# Patient Record
Sex: Female | Born: 1940 | Race: White | Hispanic: No | Marital: Married | Smoking: Never smoker
Health system: Southern US, Community
[De-identification: ages and names within clinical notes are randomized; demographics above are authoritative.]

## PROBLEM LIST (undated history)

## (undated) DIAGNOSIS — E538 Deficiency of other specified B group vitamins: Secondary | ICD-10-CM

## (undated) DIAGNOSIS — E559 Vitamin D deficiency, unspecified: Secondary | ICD-10-CM

## (undated) DIAGNOSIS — H43819 Vitreous degeneration, unspecified eye: Secondary | ICD-10-CM

## (undated) DIAGNOSIS — F418 Other specified anxiety disorders: Secondary | ICD-10-CM

## (undated) DIAGNOSIS — M1990 Unspecified osteoarthritis, unspecified site: Secondary | ICD-10-CM

## (undated) DIAGNOSIS — K602 Anal fissure, unspecified: Secondary | ICD-10-CM

## (undated) DIAGNOSIS — E785 Hyperlipidemia, unspecified: Secondary | ICD-10-CM

## (undated) DIAGNOSIS — M353 Polymyalgia rheumatica: Secondary | ICD-10-CM

## (undated) DIAGNOSIS — I10 Essential (primary) hypertension: Secondary | ICD-10-CM

## (undated) DIAGNOSIS — D128 Benign neoplasm of rectum: Secondary | ICD-10-CM

## (undated) DIAGNOSIS — M8580 Other specified disorders of bone density and structure, unspecified site: Secondary | ICD-10-CM

## (undated) DIAGNOSIS — K219 Gastro-esophageal reflux disease without esophagitis: Secondary | ICD-10-CM

## (undated) DIAGNOSIS — H269 Unspecified cataract: Secondary | ICD-10-CM

## (undated) DIAGNOSIS — E039 Hypothyroidism, unspecified: Secondary | ICD-10-CM

## (undated) DIAGNOSIS — K5732 Diverticulitis of large intestine without perforation or abscess without bleeding: Secondary | ICD-10-CM

## (undated) DIAGNOSIS — K222 Esophageal obstruction: Secondary | ICD-10-CM

## (undated) DIAGNOSIS — G4700 Insomnia, unspecified: Secondary | ICD-10-CM

## (undated) HISTORY — DX: Anal fissure, unspecified: K60.2

## (undated) HISTORY — DX: Deficiency of other specified B group vitamins: E53.8

## (undated) HISTORY — PX: TOTAL ABDOMINAL HYSTERECTOMY W/ BILATERAL SALPINGOOPHORECTOMY: SHX83

## (undated) HISTORY — DX: Vitreous degeneration, unspecified eye: H43.819

## (undated) HISTORY — DX: Esophageal obstruction: K22.2

## (undated) HISTORY — DX: Benign neoplasm of rectum: D12.8

## (undated) HISTORY — DX: Unspecified cataract: H26.9

## (undated) HISTORY — PX: WISDOM TOOTH EXTRACTION: SHX21

## (undated) HISTORY — PX: TENDON REPAIR: SHX5111

## (undated) HISTORY — DX: Hyperlipidemia, unspecified: E78.5

## (undated) HISTORY — DX: Unspecified osteoarthritis, unspecified site: M19.90

## (undated) HISTORY — DX: Other specified anxiety disorders: F41.8

## (undated) HISTORY — DX: Other specified disorders of bone density and structure, unspecified site: M85.80

## (undated) HISTORY — PX: ROTATOR CUFF REPAIR: SHX139

## (undated) HISTORY — PX: APPENDECTOMY: SHX54

## (undated) HISTORY — DX: Gastro-esophageal reflux disease without esophagitis: K21.9

## (undated) HISTORY — DX: Vitamin D deficiency, unspecified: E55.9

## (undated) HISTORY — DX: Diverticulitis of large intestine without perforation or abscess without bleeding: K57.32

## (undated) HISTORY — DX: Essential (primary) hypertension: I10

## (undated) HISTORY — DX: Insomnia, unspecified: G47.00

## (undated) HISTORY — PX: BREAST CYST ASPIRATION: SHX578

---

## 1998-10-08 ENCOUNTER — Other Ambulatory Visit: Admission: RE | Admit: 1998-10-08 | Discharge: 1998-10-08 | Payer: Self-pay | Admitting: Obstetrics and Gynecology

## 1999-08-02 ENCOUNTER — Other Ambulatory Visit: Admission: RE | Admit: 1999-08-02 | Discharge: 1999-08-02 | Payer: Self-pay | Admitting: Obstetrics and Gynecology

## 1999-08-02 ENCOUNTER — Encounter (INDEPENDENT_AMBULATORY_CARE_PROVIDER_SITE_OTHER): Payer: Self-pay

## 1999-08-31 ENCOUNTER — Encounter: Payer: Self-pay | Admitting: Obstetrics and Gynecology

## 1999-08-31 ENCOUNTER — Encounter: Admission: RE | Admit: 1999-08-31 | Discharge: 1999-08-31 | Payer: Self-pay | Admitting: Obstetrics and Gynecology

## 1999-09-03 ENCOUNTER — Encounter: Admission: RE | Admit: 1999-09-03 | Discharge: 1999-09-03 | Payer: Self-pay | Admitting: Obstetrics and Gynecology

## 1999-09-03 ENCOUNTER — Encounter: Payer: Self-pay | Admitting: Obstetrics and Gynecology

## 1999-10-15 ENCOUNTER — Other Ambulatory Visit: Admission: RE | Admit: 1999-10-15 | Discharge: 1999-10-15 | Payer: Self-pay | Admitting: Obstetrics and Gynecology

## 1999-12-13 ENCOUNTER — Encounter: Admission: RE | Admit: 1999-12-13 | Discharge: 1999-12-13 | Payer: Self-pay | Admitting: Obstetrics and Gynecology

## 1999-12-13 ENCOUNTER — Encounter: Payer: Self-pay | Admitting: Obstetrics and Gynecology

## 2000-02-15 ENCOUNTER — Ambulatory Visit (HOSPITAL_COMMUNITY): Admission: RE | Admit: 2000-02-15 | Discharge: 2000-02-15 | Payer: Self-pay | Admitting: Obstetrics and Gynecology

## 2000-02-15 ENCOUNTER — Encounter: Payer: Self-pay | Admitting: Obstetrics and Gynecology

## 2000-03-06 ENCOUNTER — Encounter (INDEPENDENT_AMBULATORY_CARE_PROVIDER_SITE_OTHER): Payer: Self-pay

## 2000-03-06 ENCOUNTER — Other Ambulatory Visit: Admission: RE | Admit: 2000-03-06 | Discharge: 2000-03-06 | Payer: Self-pay | Admitting: Obstetrics and Gynecology

## 2000-03-16 ENCOUNTER — Encounter: Payer: Self-pay | Admitting: Obstetrics and Gynecology

## 2000-03-20 ENCOUNTER — Inpatient Hospital Stay (HOSPITAL_COMMUNITY): Admission: RE | Admit: 2000-03-20 | Discharge: 2000-03-24 | Payer: Self-pay | Admitting: Obstetrics and Gynecology

## 2000-03-20 ENCOUNTER — Encounter (INDEPENDENT_AMBULATORY_CARE_PROVIDER_SITE_OTHER): Payer: Self-pay | Admitting: Specialist

## 2000-04-24 ENCOUNTER — Encounter: Admission: RE | Admit: 2000-04-24 | Discharge: 2000-04-24 | Payer: Self-pay

## 2000-04-24 ENCOUNTER — Encounter: Payer: Self-pay | Admitting: Obstetrics and Gynecology

## 2000-07-25 ENCOUNTER — Encounter: Payer: Self-pay | Admitting: Obstetrics and Gynecology

## 2000-07-25 ENCOUNTER — Encounter: Admission: RE | Admit: 2000-07-25 | Discharge: 2000-07-25 | Payer: Self-pay | Admitting: Obstetrics and Gynecology

## 2001-02-27 ENCOUNTER — Encounter: Admission: RE | Admit: 2001-02-27 | Discharge: 2001-02-27 | Payer: Self-pay | Admitting: Obstetrics and Gynecology

## 2001-02-27 ENCOUNTER — Encounter: Payer: Self-pay | Admitting: Obstetrics and Gynecology

## 2001-03-19 ENCOUNTER — Other Ambulatory Visit: Admission: RE | Admit: 2001-03-19 | Discharge: 2001-03-19 | Payer: Self-pay | Admitting: Obstetrics and Gynecology

## 2001-07-20 ENCOUNTER — Encounter: Payer: Self-pay | Admitting: Emergency Medicine

## 2001-07-20 ENCOUNTER — Emergency Department (HOSPITAL_COMMUNITY): Admission: EM | Admit: 2001-07-20 | Discharge: 2001-07-20 | Payer: Self-pay | Admitting: Emergency Medicine

## 2002-03-01 ENCOUNTER — Encounter: Admission: RE | Admit: 2002-03-01 | Discharge: 2002-03-01 | Payer: Self-pay | Admitting: Obstetrics and Gynecology

## 2002-03-01 ENCOUNTER — Encounter: Payer: Self-pay | Admitting: Obstetrics and Gynecology

## 2002-03-21 ENCOUNTER — Other Ambulatory Visit: Admission: RE | Admit: 2002-03-21 | Discharge: 2002-03-21 | Payer: Self-pay | Admitting: Obstetrics and Gynecology

## 2002-03-26 ENCOUNTER — Encounter: Admission: RE | Admit: 2002-03-26 | Discharge: 2002-03-26 | Payer: Self-pay | Admitting: Obstetrics and Gynecology

## 2002-03-26 ENCOUNTER — Encounter: Payer: Self-pay | Admitting: Obstetrics and Gynecology

## 2003-03-05 ENCOUNTER — Encounter: Payer: Self-pay | Admitting: Obstetrics and Gynecology

## 2003-03-05 ENCOUNTER — Encounter: Admission: RE | Admit: 2003-03-05 | Discharge: 2003-03-05 | Payer: Self-pay | Admitting: Obstetrics and Gynecology

## 2003-04-23 ENCOUNTER — Other Ambulatory Visit: Admission: RE | Admit: 2003-04-23 | Discharge: 2003-04-23 | Payer: Self-pay | Admitting: Obstetrics and Gynecology

## 2004-03-24 ENCOUNTER — Encounter: Admission: RE | Admit: 2004-03-24 | Discharge: 2004-03-24 | Payer: Self-pay | Admitting: Obstetrics and Gynecology

## 2004-12-13 ENCOUNTER — Ambulatory Visit (HOSPITAL_COMMUNITY): Admission: RE | Admit: 2004-12-13 | Discharge: 2004-12-13 | Payer: Self-pay | Admitting: Gastroenterology

## 2005-04-01 ENCOUNTER — Encounter: Admission: RE | Admit: 2005-04-01 | Discharge: 2005-04-01 | Payer: Self-pay | Admitting: Internal Medicine

## 2006-04-04 ENCOUNTER — Encounter: Admission: RE | Admit: 2006-04-04 | Discharge: 2006-04-04 | Payer: Self-pay | Admitting: Internal Medicine

## 2007-04-09 ENCOUNTER — Encounter: Admission: RE | Admit: 2007-04-09 | Discharge: 2007-04-09 | Payer: Self-pay | Admitting: Internal Medicine

## 2008-04-29 ENCOUNTER — Encounter: Admission: RE | Admit: 2008-04-29 | Discharge: 2008-04-29 | Payer: Self-pay | Admitting: Internal Medicine

## 2009-05-04 ENCOUNTER — Encounter: Admission: RE | Admit: 2009-05-04 | Discharge: 2009-05-04 | Payer: Self-pay | Admitting: Internal Medicine

## 2010-05-12 ENCOUNTER — Encounter: Admission: RE | Admit: 2010-05-12 | Discharge: 2010-05-12 | Payer: Self-pay | Admitting: Internal Medicine

## 2010-09-27 ENCOUNTER — Encounter: Payer: Self-pay | Admitting: Internal Medicine

## 2011-01-21 NOTE — Op Note (Signed)
NAME:  Stephanie Beasley, Stephanie Beasley                 ACCOUNT NO.:  [id number]   MEDICAL RECORD NO.:  [id number]          PATIENT TYPE:  AMB   LOCATION:  ENDO                         FACILITY:  [facility]   PHYSICIAN:  Anselmo Rod, M.D.  DATE OF BIRTH:  [birth year]   DATE OF PROCEDURE:  12/13/2004  DATE OF DISCHARGE:                                 OPERATIVE REPORT   PROCEDURE PERFORMED:  Screening colonoscopy.   INSTRUMENT USED:  Olympus video colonoscope.   INDICATION FOR PROCEDURE:  A 70 year old white female undergoing screening  colonoscopy to rule out colonic polyps, masses, etc.   PREPROCEDURE PREPARATION:  Informed consent was procured from the patient.  The patient fasted for eight hours prior to the procedure and prepped with a  bottle of magnesium citrate and a gallon of GoLYTELY the night prior to the  procedure.  The risks and benefits of the procedure, including a 10% miss  rate of cancer and polyps, were discussed with her as well.   PHYSICAL EXAMINATION:  VITAL SIGNS:  The patient had stable vital signs.  NECK:  Supple.  CHEST:  Clear to auscultation.  S1, S2 regular.  ABDOMEN:  Soft with normal bowel sounds.   DESCRIPTION OF PROCEDURE:  The patient was placed in the left lateral  decubitus position and sedated with 70 mg of Demerol and 7 mg of Versed in  slow incremental doses.  Once the patient was adequately sedate and  maintained on low-flow oxygen and continuous cardiac monitoring, the Olympus  video colonoscope was advanced from the rectum to the cecum.  The  appendiceal orifice and the ileocecal valve were clearly visualized and  photographed.  Early sigmoid diverticula were noted.  Small internal  hemorrhoids were appreciated on retroflexion in the rectum.  No masses or  polyps were identified.  The patient tolerated the procedure well without  complications.   IMPRESSION:  1.  Normal colonoscopy up to the cecum except for a few early sigmoid      diverticula and small  internal hemorrhoids.  2.  No masses or polyps seen.   RECOMMENDATIONS:  1.  Continue a high-fiber diet with liberal fluid intake.  2.  Repeat colonoscopy in the next five years unless the patient develops      any abnormal symptoms in the interim.  3.  Outpatient follow-up as the need arises in the future.      JNM/MEDQ  D:  12/13/2004  T:  12/13/2004  Job:  [id number]   cc:   Gwen Pounds, MD  Fax: [phone]   Crist Fat. Rivard, M.D.  [address]  [place]  [place] [address]  Fax: [phone]

## 2011-01-21 NOTE — Discharge Summary (Signed)
[facility]  Patient:    CHEVY, VIRGO                        MRN: [id number] Adm. Date:  [id number] Disc. Date: [id number] Attending:  Silverio Lay A                           Discharge Summary  REASON FOR ADMISSION:  Large pelvic mass.  HISTORY OF PRESENT ILLNESS:  This is a 70 year old married white female who was investigated in the office for a large pelvic mass of new onset. Preoperative findings were compatible with a fast growing lower segment uterine fibroid.  The patient was admitted to undergo exploratory laparotomy on March 20, 2000, which reveled a cervical fibroid tumor of 9 x 8 x 9 cm.  The patient underwent radical hysterectomy and bilateral salpingo-oophorectomy without complications with total intraoperative blood loss of 700 cc.  Postoperative course was uneventful except for a subfebrile state secondary to atelectasia which resolved quickly with incentive spirometry.  The patient was discharged home on March 24, 2000 with a postoperative hemoglobin of 12.  She is to be followed in the office in four to six weeks for a postoperative appointment, and she is given Tylox and Motrin for pain control.  She is also instructed to call if experiencing increased pain, bleeding, or fever.  FINAL DIAGNOSIS:  Paracervical leiomyoma with degenerative changes, status post radical hysterectomy with bilateral salpingo-oophorectomy.  CONDITION ON DISCHARGE:  Well and stable. DD:  04/17/00 TD:  04/17/00 Job: [id number]

## 2011-01-21 NOTE — Op Note (Signed)
[facility]  Patient:    ARDELL, AARONSON                        MRN: [id number] Proc. Date: 03/20/00 Adm. Date:  [id number] Attending:  Esmeralda Arthur CC:         Silverio Lay, M.D.                           Operative Report  Patient of Silverio Lay, M.D.  For details of the patients operative procedure, please see the note dictated by Dr. Dois Davenport Rivard.  This patient had a very broad lower uterine segment and upper cervix.  The cervix itself was approximately 10 cm in size.  The dissection required to remove this mass was complicated by the fact that it was an unusual mass growing in a menopausal woman, and we could not explain the significance, and we were concerned that it might be malignant.  For this reason, we did not want to interrupt the mass or place any devices into the mass that might contaminate the peritoneal cavity.  The excision of the mass was difficult; therefore, in trying to come down the sidewalls, it was necessary to completely free the ureters, effectively performing a radical hysterectomy.  Both ureters were isolated from the medial peritoneum and held with quarter-inch Penrose drains.  Using a circle technique, the ureter was unroofed down through the cardinal ligament.  The ureter on the left side was far more complicated than the one on the right.  On the left side, the ureter was unroofed, and the anterior peritoneum was divided and suture ligated.  The uterine arteries were isolated as individual structures, clamped, cut, and free-tie ligated.  The dissection on the right was similar in that the ureter had to be dissected down completely to the bladder. It was much less complicated to unroof than the ureter on the left.  Once the specimen was out, the closure was standard, and see the remainder of Dr. Antonietta Breach dictation for this procedure. DD:  03/20/00 TD:  03/20/00 Job: [id number]

## 2011-01-21 NOTE — Op Note (Signed)
[facility]  Patient:    Stephanie Beasley, Stephanie Beasley                        MRN: [id number] Proc. Date: 03/20/00 Adm. Date:  [id number] Attending:  Silverio Lay A                           Operative Report  PREOPERATIVE DIAGNOSIS:  Pelvic mass.  POSTOPERATIVE DIAGNOSIS:  Large cervical leiomyoma.  ANESTHESIA:  Spinal epidural and sedation.  PROCEDURE:  Radical hysterectomy with bilateral salpingo-oophorectomy.  SURGEON:  Silverio Lay, M.D.  ASSISTANT:  Pershing Cox, M.D.  ESTIMATED BLOOD LOSS:  700 cc.  DESCRIPTION OF PROCEDURE:  After being informed of the procedure with possible complications including bleeding, infection, trauma to the bowels, bladder, and ureters, as well as the need for omentectomy and node dissection pending frozen section, informed consent was obtained.  The patient was taken to the OR and given spinal epidural anesthesia.  She was placed in the dorsal decubitus position and prepped and draped in a sterile fashion, and a Foley catheter was inserted.  After assessing adequate level of anesthesia, skin, subcutaneous tissue and fat were incised in a midline fashion from symphysis pubis to umbilical area.  Fascia was then incised in a vertical way, first with knife then with scissors.  Linea alba was isolated, and peritoneum was entered in a blunt fashion.  We proceeded with washings of the abdominopelvic cavity with 500 cc of warm saline.  Exploration then revealed normal peritoneal surfaces as well as negative periaortic nodes, normal omentum. Uterus was of normal size.  Starting at the isthmus, a large mass measuring 9 x 8 x 7 x 8 cm completely engulfed the cervix to where the lower margins were impossible to identify.  Bladder at this point is also impossible to identify. Both tubes and ovaries are normal.  Self-retaining retractors were placed. Bowels were retracted and tacked.  Uterus was then grasped with two Kelly clamps,  and both bone ligaments were sutured with a transfixed suture of 0 Vicryl, section, which allowed [place] visualization of both retroperitoneal spaces to allow [place] to clamp under direct visualization both infundibulopelvic ligaments with Roger clamps.  Those ligaments were incised, doubly sutured, first with a transfixed suture of 0 Vicryl then with the free-tie 0 Vicryl. We could then skeletonize uterine vessels, both sides, clamp them with Rogers clamps, sectioned and sutured them with a suture of 0 Vicryl, and then doubly sutured them with a suture of 0 Vicryl.  We could now open the broad ligament overlying the mass which allowed [place] blunt dissection of the bladder downward. We then advanced progressively with 1 cm pedicle overlying the mass, clamping, sectioning, and suturing with 0 Vicryl until it was impossible to continue further without acknowledging the location of both ureters.  At that point, a decision was made to do complete ueterolysis on both sides including unroofing of the ureters on both sides which was done bluntly.  Please see attached dictation by Dr. Carey Bullocks.  After doing this ueterolysis, it was safe to address the parametrium on both sides, and we could clamp both uterosacral ligaments with Rogers clamp, sectioned them, and sutured them with a transfixed suture of 0 Vicryl.  It was, after that, possible to clamp on the left side of the vaginal angle with the Rogers clamp, section it, and open the remainder of the vagina to remove  the uterus entirely with the cervix, both tubes and ovaries.  The mass was completely cervical with no borders at the exocervix.  This was sent for frozen section.  Both vaginal angles were then sutured with a transfix suture of 0 Vicryl, and both anterior and posterior borders of the vaginal cuff were then sutured with a running lock suture of 0 Vicryl for  hemostasis.  Both vaginal angles were then sutured to corresponding uterosacral ligaments  for future suspension.  The vagina was then closed with two figure-of-eight stitches of 0 Vicryl.  Bleeding on the site of radical dissection, first on the left, was controlled with clamping and suturing close to the ureter with 3-0 Vicryl stitches.  We proceeded with left hemostasis control as well over the uterine artery, which during ueterolysis and radical dissection was isolated and clamped on both sides and doubly sutured with 0 Vicryl.  Bleeding on the left side upon the uterine artery stump was controlled with a 3-0 Vicryl stitch.  Both ureters were reassessed and showed normal size and good peristaltism.  Two amps of indigo carmine plus one dose of Lasix were necessary to trigger urine production and to assess no intraperitoneal leak and no vesical leak.  We then proceeded with irrigation of the pelvic cavity with warm saline, readdressed hemostasis which was felt to be adequate.  Retractors and packing were then removed, and we proceeded with closure of the fascia with two running sutures of 0 Prolene, double-stranded and looped.  Fat was irrigated with warm saline.  Hemostasis was obtained with cautery.  Subcutaneous tissues were infiltrated with 10 cc of Marcaine 0.25 without epinephrine, and skin was closed with staples. Instruments and sponge counts were complete x 2.  Estimated blood loss is 700 cc.  The procedure was very well tolerated by the patient who was taken to the recovery room in a well and stable condition. DD:  03/20/00 TD:  03/20/00 Job: [id number]

## 2011-01-21 NOTE — H&P (Signed)
[facility]  Patient:    Stephanie Beasley, Stephanie Beasley                          MRN: [id number] Adm. Date:  03/20/00 Attending:  Silverio Lay, M.D.                         History and Physical  DATE OF BIRTH:  10-Jul-1941  CHIEF COMPLAINT:  New onset large pelvic mass.  HISTORY OF PRESENT ILLNESS:  This is a 70 year old married white female, gravida 2, para 2, AB 0, who presented in February 2001, for her annual examination in the  office.  She has been menopause for the last five years, and has been using hormone replacement therapy with Estrace 2 mg q.d. and Provera 2.5 mg q.d. since November 2000.  She has been on a continuous hormone replacement regimen since February 2000.  She has been complaining over the last six months of metrorrhagia with a  normal endometrial biopsy in November 2000.  She still is complaining of dark brownish spotting daily, occasionally heavier like a period, without any pelvic  pain.  She denies any dyspareunia or postcoital bleeding.  REVIEW OF SYSTEMS:  CONSTITUTIONAL:  Negative.  EYES:  Negative. EARS/NOSE/THROAT: Negative.  CARDIOVASCULAR:  Chest pain relieved by the use of antacid. RESPIRATORY:  Occasional shortness of breath.  GASTROINTESTINAL:  Heartburn. GENITOURINARY:  Negative.  MUSCULOSKELETAL:  Occasional severe joint pain. SKIN/BREASTS:  Occasional breast pain.  NEUROLOGIC:  Negative.  PSYCHIATRIC: Negative.  ENDOCRINE:  Negative.  HEMATOLOGY:  Negative.  PAST MEDICAL HISTORY: 1. Known for mitral valve prolapse, not needing antibiotic prophylaxis. 2. In January 2001, breast cyst aspiration. 3. Status post appendectomy. 4. Left breast biopsy in 1993.  ALLERGIES:  No known drug allergies.  FAMILY HISTORY:  Maternal grandmother with possible breast cancer.  Maternal grandfather with a stroke.  Paternal grandmother with diabetes mellitus. Negative for feminine or colon cancer.  SOCIAL HISTORY:  Married.   Nonsmoker.  Works as a Social worker.  PHYSICAL EXAMINATION:  VITAL SIGNS:  Blood pressure 128/88, weight 175 pounds, height 5 feet 7 inches.  GENERAL:  Normal appearance, with normal habitus.  NECK:  Negative.  LUNGS:  Normal.  CARDIOVASCULAR:  Auscultation within normal limits.  ABDOMEN:  Abdomen is normal.  No herniae.  Normal liver and spleen.  NODES:  Lymph nodes are negative in the neck, axillae, and groin areas.  SKIN:  Normal.  NEUROLOGIC:  The patient is well-oriented in time, place, and person, with a normal mood and affect.  BREASTS:  Normal.  GYNECOLOGICAL:  Reveals normal external genitalia, normal vagina.  Cervix seems to be increased, size of anterior lip, and displaced posteriorly.  Adnexa normal x 2. Uterus is increased in size in the lower uterine segment in regards to the cervix, but overall normal shape.  Urethra normal.  Bladder normal.  RECTAL:  Normal examination.  Evaluation in the office included a pelvic ultrasound in February 2001, which revealed a uterine size of 11.0 cm x 9.7 cm x 5.3 cm, with a lower uterine right segment right anterior, very near the cervix, of a mass measuring 7.4 cm x 5.9 m x 7.8 cm.  Endometrial thickness was 6.0 mm.  An ultrasound was repeated on February 10, 2000, with an overall uterine volume unchanged, but an increased size in the lower uterine segment pelvic mass, now measuring 8.3 cm x 6.1 cm x  8.5 cm.  Ovaries were not seen during that ultrasound. The patient was sent for a pelvic MRI, revealing an anterior lower uterine segment fibroid measuring 9.0 cm x 8.0 cm x 7.0 cm.  The fibroid extends from the interior serosal surface to the submucosal surface of the lower uterine segment.  There s a marked mass affect upon the bladder and the lower uterine segments, and the vagina and cervix.  Both ovaries are identified and appear normal.  A Pap smear was within normal limits.  Endometrial biopsy was a normal  proliferative-type endometrium ith breakdown.  ASSESSMENT:  Rapidly growing lower uterine fibroid in a 70 year old healthy patient with an otherwise normal pelvic examination one year before.  Postmenopausal breakthrough bleeding, on continuous hormone replacement therapy.  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  PLAN:  The patient will be admitted on March 20, 2000, to undergo an exploratory  laparotomy with a total abdominal hysterectomy.  A frozen section will be requested upon surgery, and the patient is well aware of the risks of possible sarcoma, due to the rapidly growing uterine fibroid.  Prophylactic bilateral salpingo-oophorectomy was offered, since we are postmenopausal, and accepted by the patient.  The procedure was well explained to the patient, including possible complications with bleeding, infection, trauma to bowel, bladder, and uterus. he hospital stay and recovery was also reviewed with the patient and with the husband. Ms. Slee was also informed of the possibility of staging with omentectomy and pelvic node dissection, in the eventuality that frozen section is positive for sarcoma.   She voiced understanding. DD:  03/14/00 TD:  03/14/00 Job: [id number]

## 2011-02-04 HISTORY — PX: COLONOSCOPY: SHX174

## 2011-02-14 ENCOUNTER — Ambulatory Visit: Payer: Self-pay | Admitting: Internal Medicine

## 2011-02-23 ENCOUNTER — Ambulatory Visit (INDEPENDENT_AMBULATORY_CARE_PROVIDER_SITE_OTHER): Payer: BC Managed Care – PPO | Admitting: Internal Medicine

## 2011-02-23 ENCOUNTER — Encounter: Payer: Self-pay | Admitting: Internal Medicine

## 2011-02-23 DIAGNOSIS — Z1211 Encounter for screening for malignant neoplasm of colon: Secondary | ICD-10-CM

## 2011-02-23 DIAGNOSIS — R1310 Dysphagia, unspecified: Secondary | ICD-10-CM

## 2011-02-23 DIAGNOSIS — K219 Gastro-esophageal reflux disease without esophagitis: Secondary | ICD-10-CM

## 2011-02-23 DIAGNOSIS — R079 Chest pain, unspecified: Secondary | ICD-10-CM

## 2011-02-23 DIAGNOSIS — K625 Hemorrhage of anus and rectum: Secondary | ICD-10-CM

## 2011-02-23 DIAGNOSIS — K602 Anal fissure, unspecified: Secondary | ICD-10-CM

## 2011-02-23 MED ORDER — PEG-KCL-NACL-NASULF-NA ASC-C 100 G PO SOLR: 1.0000 | Freq: Once | ORAL | Status: DC

## 2011-02-23 NOTE — Progress Notes (Signed)
HISTORY OF PRESENT ILLNESS:  Stephanie Beasley is a 70 y.o. female with multiple medical problems as listed below. She is sent today by Dr. Timothy Lasso regarding dysphagia, chest pain, and surveillance colonoscopy. Patient has previously been under the care of Dr. Loreta Ave. Apparently with colonoscopy in 2002 (reported by patient), and most recently April 2006 (reviewed) which was normal except for sigmoid diverticula and internal hemorrhoids. Followup in 5 years recommended. First, the patient reports a several year history of stable chest pain. She describes this as sharp. The discomfort is reliably and immediately relieved after drinking some water. Next, she reports several year history of intermittent solid food dysphagia. This is been stable. Occasional pyrosis and regurgitation though never on PPI or antacids. Finally, some intermittent bright red blood per rectum. She does report a history of anal fissure. No abdominal pain. Her weight has been stable.  REVIEW OF SYSTEMS:  All non-GI ROS negative.  Past Medical History  Diagnosis Date  . Hyperlipidemia   . Posterior vitreous detachment   . Osteopenia   . Hypertension   . Vitamin B12 deficiency   . Insomnia   . Depression with anxiety   . GERD (gastroesophageal reflux disease)   . Vitamin D insufficiency   . Osteoarthritis   . Anal fissure   . Diverticulitis of colon     ? 2011    Past Surgical History  Procedure Date  . Rotator cuff repair   . Total abdominal hysterectomy w/ bilateral salpingoophorectomy   . Wisdom tooth extraction   . Appendectomy     Social History Stephanie Beasley  reports that she has never smoked. She does not have any smokeless tobacco history on file. She reports that she does not drink alcohol or use illicit drugs.  family history includes Breast cancer in an unspecified family member; COPD in her mother; Diabetes type II in an unspecified family member; Gout in an unspecified family member; and Stroke in an  unspecified family member.  There is no history of Colon cancer.  No Known Allergies     PHYSICAL EXAMINATION: Vital signs: BP 128/78  Pulse 100  Ht 5\' 7"  (1.702 m)  Wt 184 lb (83.462 kg)  BMI 28.82 kg/m2  Constitutional: generally well-appearing, no acute distress Psychiatric: alert and oriented x3, cooperative Eyes: extraocular movements intact, anicteric, conjunctiva pink Mouth: oral pharynx moist, no lesions Neck: supple no lymphadenopathy Cardiovascular: heart regular rate and rhythm, no murmur Lungs: clear to auscultation bilaterally Abdomen: soft, nontender, nondistended, no obvious ascites, no peritoneal signs, normal bowel sounds, no organomegaly Rectal: Deferred until colonoscopy Extremities: no lower extremity edema bilaterally Skin: no lesions on visible extremities Neuro: No focal deficits.   ASSESSMENT:  #1. Sharp intermittent chest pain. Possibly due to spasm or GERD #2. Intermittent solid food dysphagia. Rule out peptic stricture or Schatzki's ring #3. Probable GERD #4. Surveillance colonoscopy recommended. Examination 2006 as described. Followup was recommended for around 2011. Only complaint now is intermittent rectal bleeding, presumably due to fissure.   PLAN:  #1. Colonoscopy.The nature of the procedure, as well as the risks, benefits, and alternatives were carefully and thoroughly reviewed with the patient. Ample time for discussion and questions allowed. The patient understood, was satisfied, and agreed to proceed. Movi prep prescribed. Patient instructed on its use #2. Upper endoscopy with possible esophageal dilation.The nature of the procedure, as well as the risks, benefits, and alternatives were carefully and thoroughly reviewed with the patient. Ample time for discussion and questions allowed. The  patient understood, was satisfied, and agreed to proceed.  #3. May need PPI. Discussed with patient

## 2011-02-23 NOTE — Patient Instructions (Signed)
Colon/endo with possible dil. 02/28/11 3:00 pm arrive at 2:00 pm on 4th floor Moviprep prescription sent to pharmacy. Colon/Endo brochures given for you to review.

## 2011-02-28 ENCOUNTER — Ambulatory Visit (AMBULATORY_SURGERY_CENTER): Payer: BC Managed Care – PPO | Admitting: Internal Medicine

## 2011-02-28 ENCOUNTER — Encounter: Payer: Self-pay | Admitting: Internal Medicine

## 2011-02-28 VITALS — BP 137/78 | HR 72 | Temp 98.5°F | Resp 18 | Ht 67.0 in | Wt 181.0 lb

## 2011-02-28 DIAGNOSIS — Z1211 Encounter for screening for malignant neoplasm of colon: Secondary | ICD-10-CM

## 2011-02-28 DIAGNOSIS — K222 Esophageal obstruction: Secondary | ICD-10-CM

## 2011-02-28 DIAGNOSIS — K5730 Diverticulosis of large intestine without perforation or abscess without bleeding: Secondary | ICD-10-CM

## 2011-02-28 DIAGNOSIS — K2100 Gastro-esophageal reflux disease with esophagitis, without bleeding: Secondary | ICD-10-CM

## 2011-02-28 DIAGNOSIS — D126 Benign neoplasm of colon, unspecified: Secondary | ICD-10-CM

## 2011-02-28 DIAGNOSIS — R1310 Dysphagia, unspecified: Secondary | ICD-10-CM

## 2011-02-28 DIAGNOSIS — R1013 Epigastric pain: Secondary | ICD-10-CM

## 2011-02-28 MED ORDER — SODIUM CHLORIDE 0.9 % IV SOLN: 500.0000 mL | INTRAVENOUS | Status: DC

## 2011-02-28 NOTE — Patient Instructions (Signed)
Please read blue and green discharge instruction sheets 

## 2011-02-28 NOTE — Progress Notes (Signed)
Pt states that she tolerated the prep well with no n/v but that she has a fissure and multiple bm's caused irritation and bleeding. She also states that, that area is sore.

## 2011-03-01 ENCOUNTER — Telehealth: Payer: Self-pay | Admitting: *Deleted

## 2011-03-01 ENCOUNTER — Telehealth: Payer: Self-pay | Admitting: Internal Medicine

## 2011-03-01 NOTE — Telephone Encounter (Signed)

## 2011-03-02 MED ORDER — OMEPRAZOLE 40 MG PO CPDR: 40.0000 mg | DELAYED_RELEASE_CAPSULE | Freq: Every day | ORAL | Status: DC

## 2011-03-02 NOTE — Telephone Encounter (Signed)
Rx. Sent for Omeprazole to pharmacy.

## 2011-04-11 ENCOUNTER — Ambulatory Visit: Payer: BC Managed Care – PPO | Admitting: Internal Medicine

## 2011-04-22 ENCOUNTER — Encounter: Payer: Self-pay | Admitting: Internal Medicine

## 2011-04-22 ENCOUNTER — Ambulatory Visit (INDEPENDENT_AMBULATORY_CARE_PROVIDER_SITE_OTHER): Payer: BC Managed Care – PPO | Admitting: Internal Medicine

## 2011-04-22 VITALS — BP 138/66 | HR 80 | Ht 67.0 in | Wt 181.0 lb

## 2011-04-22 DIAGNOSIS — K219 Gastro-esophageal reflux disease without esophagitis: Secondary | ICD-10-CM

## 2011-04-22 DIAGNOSIS — R1310 Dysphagia, unspecified: Secondary | ICD-10-CM

## 2011-04-22 DIAGNOSIS — K222 Esophageal obstruction: Secondary | ICD-10-CM

## 2011-04-22 DIAGNOSIS — Z86010 Personal history of colonic polyps: Secondary | ICD-10-CM

## 2011-04-22 NOTE — Progress Notes (Signed)
HISTORY OF PRESENT ILLNESS:  Stephanie Beasley is a 70 y.o. female with the below listed medical history who presents today for followup. She was evaluated in June her problems with chest pain, dysphagia, and probable GERD. Also, surveillance colonoscopy. She subsequently underwent colonoscopy and upper endoscopy 02/28/2011. Colonoscopy revealed a sessile polyp of the cecum which was removed and found to be a tubular adenoma. As well, incidental sigmoid diverticulosis. Followup in 5 years recommended. We reviewed this today. Next, upper endoscopy revealed distal esophagitis with edema as well as a peptic stricture. The remainder of the examination was normal. Due to active esophagitis and edema, it was elected not to dilate the esophagus. She was prescribed omeprazole 40 mg daily, which she has been taking. She is tolerating the medication well without side effects. She returns today for followup as requested. We reviewed her endoscopic findings today. She tells me that she has no further problems with indigestion or heartburn. Her chest pain has resolved. No episodes of dysphagia.  REVIEW OF SYSTEMS:  All non-GI ROS negative.  Past Medical History  Diagnosis Date  . Hyperlipidemia   . Posterior vitreous detachment   . Osteopenia   . Hypertension   . Vitamin B12 deficiency   . Insomnia   . Depression with anxiety   . GERD (gastroesophageal reflux disease)   . Vitamin D insufficiency   . Osteoarthritis   . Anal fissure   . Diverticulitis of colon     ? 2011  . Esophageal stricture   . Tubular adenoma polyp of rectum     Past Surgical History  Procedure Date  . Rotator cuff repair   . Total abdominal hysterectomy w/ bilateral salpingoophorectomy   . Wisdom tooth extraction   . Appendectomy     Social History Stephanie Beasley  reports that she has never smoked. She does not have any smokeless tobacco history on file. She reports that she does not drink alcohol or use illicit  drugs.  family history includes Breast cancer in an unspecified family member; COPD in her mother; Diabetes type II in an unspecified family member; Gout in an unspecified family member; and Stroke in an unspecified family member.  There is no history of Colon cancer.  No Known Allergies     PHYSICAL EXAMINATION: Vital signs: BP 138/66  Pulse 80  Ht 5\' 7"  (1.702 m)  Wt 181 lb (82.101 kg)  BMI 28.35 kg/m2 General: Well-developed, well-nourished, no acute distress Abdomen: Not reexamined Psychiatric: alert and oriented x3. Cooperative    ASSESSMENT:  #1. GERD with endoscopic evidence of esophagitis and stricture. Currently asymptomatic on PPI. #2. Dysphagia related to esophagitis and stricture. Resolved on PPI without dilation #3. Chest pain. Improved on PPI. Presumably due to GERD #4. Adenomatous colon polyp on recent colonoscopy #5. Incidental diverticulosis  PLAN:  #1. Reflux precautions #2. Continue omeprazole 40 mg daily. We discussed the drug safety profile #3. Routine GI followup in one year. Contact the office in the interim for breakthrough symptoms or problems with dysphagia, she may need esophageal dilation. #4. Surveillance colonoscopy in 5 years

## 2011-04-22 NOTE — Patient Instructions (Signed)
Follow-up in one year and earlier if needed.

## 2011-05-04 ENCOUNTER — Other Ambulatory Visit: Payer: Self-pay | Admitting: Internal Medicine

## 2011-05-04 DIAGNOSIS — Z1231 Encounter for screening mammogram for malignant neoplasm of breast: Secondary | ICD-10-CM

## 2011-05-24 ENCOUNTER — Ambulatory Visit: Payer: BC Managed Care – PPO

## 2011-05-31 ENCOUNTER — Ambulatory Visit
Admission: RE | Admit: 2011-05-31 | Discharge: 2011-05-31 | Disposition: A | Discharge status: Home / Self Care | Payer: BC Managed Care – PPO | Source: Ambulatory Visit | Attending: Internal Medicine | Admitting: Internal Medicine

## 2011-05-31 DIAGNOSIS — Z1231 Encounter for screening mammogram for malignant neoplasm of breast: Secondary | ICD-10-CM

## 2012-03-01 ENCOUNTER — Telehealth: Payer: Self-pay

## 2012-03-01 MED ORDER — OMEPRAZOLE 40 MG PO CPDR: 40.0000 mg | DELAYED_RELEASE_CAPSULE | Freq: Every day | ORAL | Status: DC

## 2012-03-01 NOTE — Telephone Encounter (Signed)
Refilled rx for omeprazole

## 2012-05-01 ENCOUNTER — Other Ambulatory Visit: Payer: Self-pay | Admitting: Internal Medicine

## 2012-05-01 DIAGNOSIS — Z1231 Encounter for screening mammogram for malignant neoplasm of breast: Secondary | ICD-10-CM

## 2012-06-05 ENCOUNTER — Ambulatory Visit (INDEPENDENT_AMBULATORY_CARE_PROVIDER_SITE_OTHER): Payer: BC Managed Care – PPO

## 2012-06-05 DIAGNOSIS — Z1231 Encounter for screening mammogram for malignant neoplasm of breast: Secondary | ICD-10-CM

## 2012-06-07 ENCOUNTER — Encounter: Payer: Self-pay | Admitting: Obstetrics and Gynecology

## 2012-06-07 NOTE — Progress Notes (Signed)
Quick Note:  Please send "Dense breast" letter to patient and document in chart when letter is sent. ______ 

## 2012-06-20 ENCOUNTER — Other Ambulatory Visit: Payer: Self-pay | Admitting: Internal Medicine

## 2012-06-22 ENCOUNTER — Ambulatory Visit: Payer: Self-pay | Admitting: Obstetrics and Gynecology

## 2012-07-19 ENCOUNTER — Ambulatory Visit (INDEPENDENT_AMBULATORY_CARE_PROVIDER_SITE_OTHER): Payer: BC Managed Care – PPO | Admitting: Obstetrics and Gynecology

## 2012-07-19 ENCOUNTER — Encounter: Payer: Self-pay | Admitting: Obstetrics and Gynecology

## 2012-07-19 VITALS — BP 112/76 | HR 70 | Ht 67.0 in | Wt 175.0 lb

## 2012-07-19 DIAGNOSIS — Z01419 Encounter for gynecological examination (general) (routine) without abnormal findings: Secondary | ICD-10-CM

## 2012-07-19 NOTE — Progress Notes (Signed)
The patient is not taking hormone replacement therapy The patient  is not taking a Calcium supplement. Post-menopausal bleeding:no  Last Pap: was normal August  2004 Last mammogram: was normal, dense breast October  2013 Last DEXA scan : T=     January  2012, PCP does DEXA, pt thinks she had one last year, no results in paper chart Last colonoscopy:normal June 2012  Urinary symptoms: none Normal bowel movements: Yes Reports abuse at home: No:  Subjective:    Stephanie Beasley is a 71 y.o. female G2P2 who presents for annual exam.  The patient has no complaints today.   The following portions of the patient's history were reviewed and updated as appropriate: allergies, current medications, past family history, past medical history, past social history, past surgical history and problem list.  Review of Systems Pertinent items are noted in HPI. Gastrointestinal:No change in bowel habits, no abdominal pain, no rectal bleeding Genitourinary:negative for dysuria, frequency, hematuria, nocturia and urinary incontinence    Objective:     BP 112/76  Pulse 70  Ht 5\' 7"  (1.702 m)  Wt 175 lb (79.379 kg)  BMI 27.41 kg/m2  Weight:  Wt Readings from Last 1 Encounters:  07/19/12 175 lb (79.379 kg)     BMI: Body mass index is 27.41 kg/(m^2). General Appearance: Alert, appropriate appearance for age. No acute distress HEENT: Grossly normal Neck / Thyroid: Supple, no masses, nodes or enlargement Lungs: clear to auscultation bilaterally Back: No CVA tenderness Breast Exam: No masses or nodes.No dimpling, nipple retraction or discharge. Cardiovascular: Regular rate and rhythm. S1, S2, no murmur Gastrointestinal: Soft, non-tender, no masses or organomegaly Pelvic Exam: VV normal. Uterus and adnexa surgically absent Rectovaginal: normal rectal, no masses Lymphatic Exam: Non-palpable nodes in neck, clavicular, axillary, or inguinal regions Skin: no rash or abnormalities Neurologic: Normal gait  and speech, no tremor  Psychiatric: Alert and oriented, appropriate affect.    Assessment:    Normal gyn exam    Plan:   mammogram pap smear return annually or prn Vitamin D discussed.    Silverio Lay MD

## 2012-07-27 ENCOUNTER — Other Ambulatory Visit: Payer: Self-pay | Admitting: Internal Medicine

## 2012-08-01 ENCOUNTER — Telehealth: Payer: Self-pay

## 2012-08-01 MED ORDER — OMEPRAZOLE 40 MG PO CPDR: 40.0000 mg | DELAYED_RELEASE_CAPSULE | Freq: Every day | ORAL | Status: DC

## 2012-08-01 NOTE — Telephone Encounter (Signed)
Refilled omeprazole.

## 2012-10-23 ENCOUNTER — Other Ambulatory Visit: Payer: Self-pay | Admitting: Internal Medicine

## 2012-12-24 ENCOUNTER — Other Ambulatory Visit: Payer: Self-pay | Admitting: Internal Medicine

## 2012-12-25 ENCOUNTER — Other Ambulatory Visit: Payer: Self-pay | Admitting: Internal Medicine

## 2013-05-15 ENCOUNTER — Other Ambulatory Visit: Payer: Self-pay | Admitting: Internal Medicine

## 2013-05-15 DIAGNOSIS — Z1231 Encounter for screening mammogram for malignant neoplasm of breast: Secondary | ICD-10-CM

## 2013-05-24 ENCOUNTER — Other Ambulatory Visit: Payer: Self-pay | Admitting: Internal Medicine

## 2013-06-06 ENCOUNTER — Ambulatory Visit: Payer: BC Managed Care – PPO

## 2013-06-13 ENCOUNTER — Ambulatory Visit (INDEPENDENT_AMBULATORY_CARE_PROVIDER_SITE_OTHER): Payer: BC Managed Care – PPO

## 2013-06-13 DIAGNOSIS — Z1231 Encounter for screening mammogram for malignant neoplasm of breast: Secondary | ICD-10-CM

## 2013-07-10 ENCOUNTER — Ambulatory Visit (INDEPENDENT_AMBULATORY_CARE_PROVIDER_SITE_OTHER): Payer: BC Managed Care – PPO | Admitting: Internal Medicine

## 2013-07-10 ENCOUNTER — Encounter: Payer: Self-pay | Admitting: Internal Medicine

## 2013-07-10 VITALS — BP 124/68 | HR 70 | Ht 67.0 in | Wt 177.0 lb

## 2013-07-10 DIAGNOSIS — Z860100 Personal history of colon polyps, unspecified: Secondary | ICD-10-CM

## 2013-07-10 DIAGNOSIS — K648 Other hemorrhoids: Secondary | ICD-10-CM

## 2013-07-10 DIAGNOSIS — K625 Hemorrhage of anus and rectum: Secondary | ICD-10-CM

## 2013-07-10 DIAGNOSIS — K219 Gastro-esophageal reflux disease without esophagitis: Secondary | ICD-10-CM

## 2013-07-10 MED ORDER — OMEPRAZOLE 40 MG PO CPDR: 40.0000 mg | DELAYED_RELEASE_CAPSULE | Freq: Every day | ORAL | Status: DC

## 2013-07-10 MED ORDER — HYDROCORTISONE ACETATE 25 MG RE SUPP: 25.0000 mg | Freq: Every evening | RECTAL | Status: DC | PRN

## 2013-07-10 NOTE — Patient Instructions (Signed)
We have sent the following medications to your pharmacy for you to pick up at your convenience:  Omeprazole, Anusol HC suppositories  Please follow up with Dr. Marina Goodell as needed

## 2013-07-10 NOTE — Progress Notes (Signed)
HISTORY OF PRESENT ILLNESS:  Stephanie Beasley is a 72 y.o. female with the below listed medical history who presents today for followup regarding management of chronic GERD as well as a new complaint of rectal bleeding. She was last evaluated in the office 04/22/2011. See that dictation. Previous upper endoscopy revealed esophagitis and stricture. She was placed on PPI therapy with resolution of reflux symptoms, chest pain, and dysphagia. Previous colonoscopy in 2012 demonstrated diverticulosis and a subcentimeter adenoma which was removed. She has been taking omeprazole 40 mg daily. On medication to control symptoms. No appreciable medication side effects. Off medication, recurrent chest pain and GERD symptoms. She requests refill. Next, she reports a one-month history of rectal bleeding associated with constipation. Blood noticed only after straining with bowel movements. Bright red blood. Minimal rectal discomfort. No abdominal pain. She has been using Colace which has improved her constipation. She had no issues with bleeding over the past week until this morning. GI review of systems is otherwise negative. She tells me that she takes a probiotic chronically, and wonders if this is important. She does not notice any difference in her disposition the she takes probiotic or not  REVIEW OF SYSTEMS:  All non-GI ROS negative upon review  Past Medical History  Diagnosis Date  . Hyperlipidemia   . Posterior vitreous detachment   . Osteopenia   . Hypertension   . Vitamin B12 deficiency   . Insomnia   . Depression with anxiety   . GERD (gastroesophageal reflux disease)   . Vitamin D insufficiency   . Osteoarthritis   . Anal fissure   . Diverticulitis of colon     ? 2011  . Esophageal stricture   . Tubular adenoma polyp of rectum     Past Surgical History  Procedure Laterality Date  . Rotator cuff repair      x 2  . Total abdominal hysterectomy w/ bilateral salpingoophorectomy    . Wisdom  tooth extraction    . Appendectomy      Social History Stephanie Beasley  reports that she has never smoked. She has never used smokeless tobacco. She reports that she does not drink alcohol or use illicit drugs.  family history includes Breast cancer in an other family member; COPD in her mother; Diabetes type II in an other family member; Gout in an other family member; Stroke in an other family member. There is no history of Colon cancer.  No Known Allergies     PHYSICAL EXAMINATION: Vital signs: BP 124/68  Pulse 70  Ht 5\' 7"  (1.702 m)  Wt 177 lb (80.287 kg)  BMI 27.72 kg/m2  Constitutional: generally well-appearing, no acute distress Psychiatric: alert and oriented x3, cooperative Eyes: extraocular movements intact, anicteric, conjunctiva pink Mouth: oral pharynx moist, no lesions Neck: supple no lymphadenopathy Cardiovascular: heart regular rate and rhythm, no murmur Lungs: clear to auscultation bilaterally Abdomen: soft, nontender, nondistended, no obvious ascites, no peritoneal signs, normal bowel sounds, no organomegaly Rectal: Inflamed internal hemorrhoid. Otherwise normal Extremities: no lower extremity edema bilaterally Skin: no lesions on visible extremities Neuro: No focal deficits.   ASSESSMENT:  #1. GERD with esophagitis and stricture on previous endoscopy 2012. Currently asymptomatic on PPI. Needs a medication refill #2. Minor intermittent rectal bleeding secondary to hemorrhoids #3. Constipation. Adequately relieved with stool softeners #4. History of adenoma on colonoscopy 2012   PLAN:  #1. Reflux precautions #2. Continue omeprazole 40 mg daily. Refill omeprazole 40 mg daily. Prescription submitted electronically #3. Daily  fiber supplementation in addition to stool softener #4. Prescribed Anusol-HC suppositories, one at night as needed #5. No need to chronically use probiotics. Save the money #6 Surveillance colonoscopy around June 2017. Interval followup  as needed

## 2013-12-31 ENCOUNTER — Other Ambulatory Visit: Payer: Self-pay | Admitting: Internal Medicine

## 2014-01-11 ENCOUNTER — Emergency Department (INDEPENDENT_AMBULATORY_CARE_PROVIDER_SITE_OTHER)
Admission: EM | Admit: 2014-01-11 | Discharge: 2014-01-11 | Disposition: A | Discharge status: Home / Self Care | Payer: BC Managed Care – PPO | Source: Home / Self Care | Attending: Family Medicine | Admitting: Family Medicine

## 2014-01-11 ENCOUNTER — Encounter: Payer: Self-pay | Admitting: Emergency Medicine

## 2014-01-11 DIAGNOSIS — B029 Zoster without complications: Secondary | ICD-10-CM | POA: Diagnosis not present

## 2014-01-11 MED ORDER — VALACYCLOVIR HCL 1 G PO TABS: 1000.0000 mg | ORAL_TABLET | Freq: Three times a day (TID) | ORAL | Status: DC

## 2014-01-11 NOTE — ED Provider Notes (Signed)
CSN: [id number]     Arrival date & time 01/11/14  1205 History   First MD Initiated Contact with Patient 01/11/14 1237     Chief Complaint  Patient presents with  . Headache    x 4 days  . Rash    x 1 day      HPI Comments: For the past 4 days patient has felt fatigued and had a left sided headache.  She normally does not have headaches.  Today she noticed an itching, tingling rash on her left forehead extending into left frontal scalp.  No lesions on cheek or nose.  No fevers, chills, and sweats   Patient is a 73 y.o. female presenting with rash. The history is provided by the patient.  Rash Pain location: left forehead and scalp. Pain quality comment:  Itching and tingling Pain radiates to:  Does not radiate Pain severity:  Mild Onset quality:  Sudden Duration:  6 hours Timing:  Constant Progression:  Worsening Chronicity:  New Relieved by:  None tried Worsened by:  Nothing tried Ineffective treatments:  None tried Associated symptoms: no anorexia, no chills, no fatigue, no fever, no nausea and no sore throat   Risk factors: being elderly     Past Medical History  Diagnosis Date  . Hyperlipidemia   . Posterior vitreous detachment   . Osteopenia   . Hypertension   . Vitamin B12 deficiency   . Insomnia   . Depression with anxiety   . GERD (gastroesophageal reflux disease)   . Vitamin D insufficiency   . Osteoarthritis   . Anal fissure   . Diverticulitis of colon     ? 2011  . Esophageal stricture   . Tubular adenoma polyp of rectum    Past Surgical History  Procedure Laterality Date  . Rotator cuff repair      x 2  . Total abdominal hysterectomy w/ bilateral salpingoophorectomy    . Wisdom tooth extraction    . Appendectomy     Family History  Problem Relation Age of Onset  . COPD Mother   . Gout    . Diabetes type II    . Stroke    . Breast cancer      grandmother  . Colon cancer Neg Hx    History  Substance Use Topics  . Smoking status: Never  Smoker   . Smokeless tobacco: Never Used  . Alcohol Use: No   OB History   Grav Para Term Preterm Abortions TAB SAB Ect Mult Living   2 2        2      Review of Systems  Constitutional: Negative for fever, chills and fatigue.  HENT: Negative for sore throat.   Gastrointestinal: Negative for nausea and anorexia.  Skin: Positive for rash.  All other systems reviewed and are negative.   Allergies  Review of patient's allergies indicates no known allergies.  Home Medications   Prior to Admission medications   Medication Sig Start Date End Date Taking? Authorizing Provider  ALPRAZolam Duanne Moron) 0.5 MG tablet Take 0.5 mg by mouth at bedtime as needed. 1/2-1 at bedtime    Yes Historical Provider, MD  Cholecalciferol (VITAMIN D3) 2000 UNITS TABS Take by mouth.   Yes Historical Provider, MD  Cyanocobalamin (VITAMIN B 12 PO) Take 2,500 mcg by mouth daily.    Yes Historical Provider, MD  cycloSPORINE (RESTASIS) 0.05 % ophthalmic emulsion 1 drop 2 (two) times daily.   Yes Historical Provider, MD  docusate  sodium (COLACE) 100 MG capsule Take 100 mg by mouth daily.   Yes Historical Provider, MD  hydrocortisone (ANUSOL-HC) 25 MG suppository Place 1 suppository (25 mg total) rectally at bedtime as needed for hemorrhoids or itching. 07/10/13  Yes Irene Shipper, MD  lisinopril (PRINIVIL,ZESTRIL) 40 MG tablet Take 40 mg by mouth daily.     Yes Historical Provider, MD  Omega-3 Fatty Acids (FISH OIL TRIPLE STRENGTH) 1400 MG CAPS Take 1 capsule by mouth daily.     Yes Historical Provider, MD  omeprazole (PRILOSEC) 40 MG capsule TAKE 1 CAPSULE (40 MG TOTAL) BY MOUTH DAILY. 12/31/13  Yes Irene Shipper, MD  simvastatin (ZOCOR) 20 MG tablet Take 20 mg by mouth at bedtime.     Yes Historical Provider, MD  omeprazole (PRILOSEC OTC) 20 MG tablet Take 20 mg by mouth daily.    Historical Provider, MD  Probiotic Product (PROBIOTIC DAILY PO) Take 1 capsule by mouth daily.    Historical Provider, MD   BP 127/83  Pulse  77  Temp(Src) 98.2 F (36.8 C) (Oral)  Ht 5\' 7"  (1.702 m)  Wt 175 lb (79.379 kg)  BMI 27.40 kg/m2  SpO2 95% Physical Exam  Nursing note and vitals reviewed. Constitutional: She is oriented to person, place, and time. She appears well-developed and well-nourished. No distress.  HENT:  Head: Normocephalic.    Right Ear: External ear normal.  Left Ear: External ear normal.  Nose: Nose normal.  Mouth/Throat: Oropharynx is clear and moist. No oropharyngeal exudate.  Left forehead has a patchy macular erythematous eruption extending into scalp.  No swelling or tenderness.                                                                                                                                                                                                                                                 Eyes: Conjunctivae and EOM are normal. Pupils are equal, round, and reactive to light. Right eye exhibits no discharge. Left eye exhibits no discharge.  Neck: Neck supple.  Lymphadenopathy:    She has no cervical adenopathy.  Neurological: She is alert and oriented to person, place, and time.  Skin: Skin is warm and dry.    ED Course  Procedures  none      MDM   1. Herpes zoster    Begin Valtrex  Followup with PCP as scheduled in 5 days.    Kandra Nicolas, MD 01/11/14 [phone]

## 2014-01-11 NOTE — ED Notes (Signed)
Stephanie Beasley complains of a headache for 4 days. No headache today. She reports a rash on her forehead for 1 day. The rash is tender and itches. Denies fever, chills or sweats. She did receive a shingles vaccine a few years ago.

## 2014-01-11 NOTE — Discharge Instructions (Signed)
Shingles Shingles (herpes zoster) is an infection that is caused by the same virus that causes chickenpox (varicella). The infection causes a painful skin rash and fluid-filled blisters, which eventually break open, crust over, and heal. It may occur in any area of the body, but it usually affects only one side of the body or face. The pain of shingles usually lasts about 1 month. However, some people with shingles may develop long-term (chronic) pain in the affected area of the body. Shingles often occurs many years after the person had chickenpox. It is more common:  In people older than 50 years.  In people with weakened immune systems, such as those with HIV, AIDS, or cancer.  In people taking medicines that weaken the immune system, such as transplant medicines.  In people under great stress. CAUSES  Shingles is caused by the varicella zoster virus (VZV), which also causes chickenpox. After a person is infected with the virus, it can remain in the person's body for years in an inactive state (dormant). To cause shingles, the virus reactivates and breaks out as an infection in a nerve root. The virus can be spread from person to person (contagious) through contact with open blisters of the shingles rash. It will only spread to people who have not had chickenpox. When these people are exposed to the virus, they may develop chickenpox. They will not develop shingles. Once the blisters scab over, the person is no longer contagious and cannot spread the virus to others. SYMPTOMS  Shingles shows up in stages. The initial symptoms may be pain, itching, and tingling in an area of the skin. This pain is usually described as burning, stabbing, or throbbing.In a few days or weeks, a painful red rash will appear in the area where the pain, itching, and tingling were felt. The rash is usually on one side of the body in a band or belt-like pattern. Then, the rash usually turns into fluid-filled blisters. They  will scab over and dry up in approximately 2 3 weeks. Flu-like symptoms may also occur with the initial symptoms, the rash, or the blisters. These may include:  Fever.  Chills.  Headache.  Upset stomach. DIAGNOSIS  Your caregiver will perform a skin exam to diagnose shingles. Skin scrapings or fluid samples may also be taken from the blisters. This sample will be examined under a microscope or sent to a lab for further testing. TREATMENT  There is no specific cure for shingles. Your caregiver will likely prescribe medicines to help you manage the pain, recover faster, and avoid long-term problems. This may include antiviral drugs, anti-inflammatory drugs, and pain medicines. HOME CARE INSTRUCTIONS   Take a cool bath or apply cool compresses to the area of the rash or blisters as directed. This may help with the pain and itching.   Only take over-the-counter or prescription medicines as directed by your caregiver.   Rest as directed by your caregiver.  Keep your rash and blisters clean with mild soap and cool water or as directed by your caregiver.  Do not pick your blisters or scratch your rash. Apply an anti-itch cream or numbing creams to the affected area as directed by your caregiver.  Keep your shingles rash covered with a loose bandage (dressing).  Avoid skin contact with:  Babies.   Pregnant women.   Children with eczema.   Elderly people with transplants.   People with chronic illnesses, such as leukemia or AIDS.   Wear loose-fitting clothing to help ease   the pain of material rubbing against the rash.  Keep all follow-up appointments with your caregiver.If the area involved is on your face, you may receive a referral for follow-up to a specialist, such as an eye doctor (ophthalmologist) or an ear, nose, and throat (ENT) doctor. Keeping all follow-up appointments will help you avoid eye complications, chronic pain, or disability.  SEEK IMMEDIATE MEDICAL  CARE IF:   You have facial pain, pain around the eye area, or loss of feeling on one side of your face.  You have ear pain or ringing in your ear.  You have loss of taste.  Your pain is not relieved with prescribed medicines.   Your redness or swelling spreads.   You have more pain and swelling.  Your condition is worsening or has changed.   You have a feveror persistent symptoms for more than 2 3 days.  You have a fever and your symptoms suddenly get worse. MAKE SURE YOU:  Understand these instructions.  Will watch your condition.  Will get help right away if you are not doing well or get worse. Document Released: 08/22/2005 Document Revised: 05/16/2012 Document Reviewed: 04/05/2012 [employer] Patient Information 2014 [employer].  

## 2014-01-16 DIAGNOSIS — E538 Deficiency of other specified B group vitamins: Secondary | ICD-10-CM | POA: Diagnosis not present

## 2014-01-16 DIAGNOSIS — K219 Gastro-esophageal reflux disease without esophagitis: Secondary | ICD-10-CM | POA: Diagnosis not present

## 2014-01-16 DIAGNOSIS — D126 Benign neoplasm of colon, unspecified: Secondary | ICD-10-CM | POA: Diagnosis not present

## 2014-01-16 DIAGNOSIS — I10 Essential (primary) hypertension: Secondary | ICD-10-CM | POA: Diagnosis not present

## 2014-01-16 DIAGNOSIS — Z6827 Body mass index (BMI) 27.0-27.9, adult: Secondary | ICD-10-CM | POA: Diagnosis not present

## 2014-01-16 DIAGNOSIS — K5900 Constipation, unspecified: Secondary | ICD-10-CM | POA: Diagnosis not present

## 2014-01-16 DIAGNOSIS — B029 Zoster without complications: Secondary | ICD-10-CM | POA: Diagnosis not present

## 2014-01-16 DIAGNOSIS — E785 Hyperlipidemia, unspecified: Secondary | ICD-10-CM | POA: Diagnosis not present

## 2014-05-27 ENCOUNTER — Other Ambulatory Visit: Payer: Self-pay | Admitting: Internal Medicine

## 2014-05-27 DIAGNOSIS — Z1231 Encounter for screening mammogram for malignant neoplasm of breast: Secondary | ICD-10-CM

## 2014-06-18 ENCOUNTER — Ambulatory Visit (INDEPENDENT_AMBULATORY_CARE_PROVIDER_SITE_OTHER): Payer: Medicare Other

## 2014-06-18 DIAGNOSIS — Z1231 Encounter for screening mammogram for malignant neoplasm of breast: Secondary | ICD-10-CM

## 2014-06-26 ENCOUNTER — Other Ambulatory Visit: Payer: Self-pay | Admitting: Internal Medicine

## 2014-07-07 ENCOUNTER — Encounter: Payer: Self-pay | Admitting: Emergency Medicine

## 2014-07-21 DIAGNOSIS — I10 Essential (primary) hypertension: Secondary | ICD-10-CM | POA: Diagnosis not present

## 2014-07-21 DIAGNOSIS — E785 Hyperlipidemia, unspecified: Secondary | ICD-10-CM | POA: Diagnosis not present

## 2014-07-21 DIAGNOSIS — M859 Disorder of bone density and structure, unspecified: Secondary | ICD-10-CM | POA: Diagnosis not present

## 2014-07-21 DIAGNOSIS — E538 Deficiency of other specified B group vitamins: Secondary | ICD-10-CM | POA: Diagnosis not present

## 2014-07-25 DIAGNOSIS — Z0000 Encounter for general adult medical examination without abnormal findings: Secondary | ICD-10-CM | POA: Diagnosis not present

## 2014-07-25 DIAGNOSIS — I10 Essential (primary) hypertension: Secondary | ICD-10-CM | POA: Diagnosis not present

## 2014-07-25 DIAGNOSIS — L659 Nonscarring hair loss, unspecified: Secondary | ICD-10-CM | POA: Diagnosis not present

## 2014-07-25 DIAGNOSIS — H04129 Dry eye syndrome of unspecified lacrimal gland: Secondary | ICD-10-CM | POA: Diagnosis not present

## 2014-07-25 DIAGNOSIS — Z1389 Encounter for screening for other disorder: Secondary | ICD-10-CM | POA: Diagnosis not present

## 2014-07-25 DIAGNOSIS — Z6827 Body mass index (BMI) 27.0-27.9, adult: Secondary | ICD-10-CM | POA: Diagnosis not present

## 2014-07-25 DIAGNOSIS — K5900 Constipation, unspecified: Secondary | ICD-10-CM | POA: Diagnosis not present

## 2014-07-25 DIAGNOSIS — J310 Chronic rhinitis: Secondary | ICD-10-CM | POA: Diagnosis not present

## 2014-07-25 DIAGNOSIS — Z23 Encounter for immunization: Secondary | ICD-10-CM | POA: Diagnosis not present

## 2014-08-01 DIAGNOSIS — Z1212 Encounter for screening for malignant neoplasm of rectum: Secondary | ICD-10-CM | POA: Diagnosis not present

## 2014-08-30 ENCOUNTER — Emergency Department (INDEPENDENT_AMBULATORY_CARE_PROVIDER_SITE_OTHER)
Admission: EM | Admit: 2014-08-30 | Discharge: 2014-08-30 | Disposition: A | Discharge status: Home / Self Care | Payer: Medicare Other | Source: Home / Self Care | Attending: Emergency Medicine | Admitting: Emergency Medicine

## 2014-08-30 DIAGNOSIS — J209 Acute bronchitis, unspecified: Secondary | ICD-10-CM | POA: Diagnosis not present

## 2014-08-30 MED ORDER — HYDROCOD POLST-CHLORPHEN POLST 10-8 MG/5ML PO LQCR: 5.0000 mL | Freq: Every evening | ORAL | Status: DC | PRN

## 2014-08-30 MED ORDER — AZITHROMYCIN 250 MG PO TABS: ORAL_TABLET | ORAL | Status: DC

## 2014-08-30 MED ORDER — BENZONATATE 200 MG PO CAPS: ORAL_CAPSULE | ORAL | Status: DC

## 2014-08-30 NOTE — ED Notes (Signed)
Stephanie Beasley complains of body aches, runny nose, sneezing, congestion, hoarseness and cough was dry now congested for 1 week. She is up to date with her flu vaccine.

## 2014-08-30 NOTE — ED Provider Notes (Signed)
CSN: [id number]     Arrival date & time 08/30/14  1006 History   First MD Initiated Contact with Patient 08/30/14 1008     Chief Complaint  Patient presents with  . Generalized Body Aches  . Cough  . Nasal Congestion   (Consider location/radiation/quality/duration/timing/severity/associated sxs/prior Treatment) HPI URI HISTORY  Stephanie Beasley is a 73 y.o. female who complains of onset of cold symptoms for 7 days. Have been using over-the-counter treatment which helps a little bit.  No chills/sweats +  Fever  +  Nasal congestion and hoarseness +  Discolored Post-nasal drainage No sinus pain/pressure No sore throat  +  cough No wheezing No chest congestion No hemoptysis No shortness of breath No pleuritic pain  No itchy/red eyes No earache  No nausea No vomiting No abdominal pain No diarrhea  No skin rashes +  Fatigue + myalgias No headache   Past Medical History  Diagnosis Date  . Hyperlipidemia   . Posterior vitreous detachment   . Osteopenia   . Hypertension   . Vitamin B12 deficiency   . Insomnia   . Depression with anxiety   . GERD (gastroesophageal reflux disease)   . Vitamin D insufficiency   . Osteoarthritis   . Anal fissure   . Diverticulitis of colon     ? 2011  . Esophageal stricture   . Tubular adenoma polyp of rectum    Past Surgical History  Procedure Laterality Date  . Rotator cuff repair      x 2  . Total abdominal hysterectomy w/ bilateral salpingoophorectomy    . Wisdom tooth extraction    . Appendectomy     Family History  Problem Relation Age of Onset  . COPD Mother   . Gout    . Diabetes type II    . Stroke    . Breast cancer      grandmother  . Colon cancer Neg Hx    History  Substance Use Topics  . Smoking status: Never Smoker   . Smokeless tobacco: Never Used  . Alcohol Use: No   OB History    Gravida Para Term Preterm AB TAB SAB Ectopic Multiple Living   2 2        2      Review of Systems  All other systems  reviewed and are negative.   Allergies  Review of patient's allergies indicates no known allergies.  Home Medications   Prior to Admission medications   Medication Sig Start Date End Date Taking? Authorizing Provider  ALPRAZolam Duanne Moron) 0.5 MG tablet Take 0.5 mg by mouth at bedtime as needed. 1/2-1 at bedtime    Yes Historical Provider, MD  Cholecalciferol (VITAMIN D3) 2000 UNITS TABS Take by mouth.   Yes Historical Provider, MD  Cyanocobalamin (VITAMIN B 12 PO) Take 2,500 mcg by mouth daily.    Yes Historical Provider, MD  cycloSPORINE (RESTASIS) 0.05 % ophthalmic emulsion 1 drop 2 (two) times daily.   Yes Historical Provider, MD  docusate sodium (COLACE) 100 MG capsule Take 100 mg by mouth daily.   Yes Historical Provider, MD  hydrocortisone (ANUSOL-HC) 25 MG suppository Place 1 suppository (25 mg total) rectally at bedtime as needed for hemorrhoids or itching. 07/10/13  Yes Irene Shipper, MD  lisinopril (PRINIVIL,ZESTRIL) 40 MG tablet Take 40 mg by mouth daily.     Yes Historical Provider, MD  Omega-3 Fatty Acids (FISH OIL TRIPLE STRENGTH) 1400 MG CAPS Take 1 capsule by mouth daily.  Yes Historical Provider, MD  omeprazole (PRILOSEC) 40 MG capsule TAKE 1 CAPSULE (40 MG TOTAL) BY MOUTH DAILY. 06/30/14  Yes Irene Shipper, MD  Probiotic Product (PROBIOTIC DAILY PO) Take 1 capsule by mouth daily.   Yes Historical Provider, MD  simvastatin (ZOCOR) 20 MG tablet Take 20 mg by mouth at bedtime.     Yes Historical Provider, MD  valACYclovir (VALTREX) 1000 MG tablet Take 1 tablet (1,000 mg total) by mouth 3 (three) times daily. 01/11/14  Yes Kandra Nicolas, MD  azithromycin (ZITHROMAX Z-PAK) 250 MG tablet Take 2 tablets on day one, then 1 tablet daily on days 2 through 5 08/30/14   Jacqulyn Cane, MD  benzonatate (TESSALON) 200 MG capsule Take 1 every 8 hours as needed for cough. 08/30/14   Jacqulyn Cane, MD  chlorpheniramine-HYDROcodone [facility]) 10-8 MG/5ML LQCR Take 5 mLs by mouth at  bedtime as needed for cough. For cough. 08/30/14   Jacqulyn Cane, MD  omeprazole (PRILOSEC OTC) 20 MG tablet Take 20 mg by mouth daily.    Historical Provider, MD   BP 129/81 mmHg  Pulse 90  Temp(Src) 98.6 F (37 C) (Oral)  Resp 17  Ht 5\' 7"  (1.702 m)  Wt 174 lb (78.926 kg)  BMI 27.25 kg/m2  SpO2 96% Physical Exam  Constitutional: She is oriented to person, place, and time. She appears well-developed and well-nourished. No distress.  HENT:  Head: Normocephalic and atraumatic.  Right Ear: Tympanic membrane normal.  Left Ear: Tympanic membrane normal.  Nose: Nose normal.  Mouth/Throat: Oropharynx is clear and moist. No oropharyngeal exudate.  Eyes: Right eye exhibits no discharge. Left eye exhibits no discharge. No scleral icterus.  Neck: Neck supple.  Cardiovascular: Normal rate, regular rhythm and normal heart sounds.   Pulmonary/Chest: No respiratory distress. She has no wheezes. She has rhonchi. She has no rales.  Lymphadenopathy:    She has no cervical adenopathy.  Neurological: She is alert and oriented to person, place, and time.  Skin: Skin is warm and dry.  Nursing note and vitals reviewed.   ED Course  Procedures (including critical care time) Labs Review Labs Reviewed - No data to display  Imaging Review No results found.   MDM   1. Acute bronchitis, unspecified organism    Discharge Medication List as of 08/30/2014 12:00 PM    START taking these medications   Details  azithromycin (ZITHROMAX Z-PAK) 250 MG tablet Take 2 tablets on day one, then 1 tablet daily on days 2 through 5, Print    benzonatate (TESSALON) 200 MG capsule Take 1 every 8 hours as needed for cough., Print    chlorpheniramine-HYDROcodone (TUSSIONEX PENNKINETIC ER) 10-8 MG/5ML LQCR Take 5 mLs by mouth at bedtime as needed for cough. For cough., Starting 08/30/2014, Until Discontinued, Print       Follow-up with your primary care doctor in 5-7 days if not improving, or sooner if symptoms  become worse. Precautions discussed. Red flags discussed. Questions invited and answered. Patient voiced understanding and agreement.     Jacqulyn Cane, MD 08/30/14 2209

## 2014-09-02 DIAGNOSIS — Z6827 Body mass index (BMI) 27.0-27.9, adult: Secondary | ICD-10-CM | POA: Diagnosis not present

## 2014-09-02 DIAGNOSIS — R05 Cough: Secondary | ICD-10-CM | POA: Diagnosis not present

## 2014-09-02 DIAGNOSIS — M62830 Muscle spasm of back: Secondary | ICD-10-CM | POA: Diagnosis not present

## 2014-09-24 DIAGNOSIS — Z23 Encounter for immunization: Secondary | ICD-10-CM | POA: Diagnosis not present

## 2014-09-24 DIAGNOSIS — M859 Disorder of bone density and structure, unspecified: Secondary | ICD-10-CM | POA: Diagnosis not present

## 2014-11-24 DIAGNOSIS — H04123 Dry eye syndrome of bilateral lacrimal glands: Secondary | ICD-10-CM | POA: Diagnosis not present

## 2014-12-09 DIAGNOSIS — H25013 Cortical age-related cataract, bilateral: Secondary | ICD-10-CM | POA: Diagnosis not present

## 2014-12-09 DIAGNOSIS — H2513 Age-related nuclear cataract, bilateral: Secondary | ICD-10-CM | POA: Diagnosis not present

## 2014-12-09 DIAGNOSIS — H25041 Posterior subcapsular polar age-related cataract, right eye: Secondary | ICD-10-CM | POA: Insufficient documentation

## 2014-12-09 DIAGNOSIS — H1852 Epithelial (juvenile) corneal dystrophy: Secondary | ICD-10-CM | POA: Diagnosis not present

## 2014-12-09 DIAGNOSIS — H25011 Cortical age-related cataract, right eye: Secondary | ICD-10-CM | POA: Insufficient documentation

## 2014-12-09 DIAGNOSIS — H25043 Posterior subcapsular polar age-related cataract, bilateral: Secondary | ICD-10-CM | POA: Diagnosis not present

## 2014-12-09 DIAGNOSIS — H18529 Epithelial (juvenile) corneal dystrophy, unspecified eye: Secondary | ICD-10-CM | POA: Insufficient documentation

## 2014-12-24 ENCOUNTER — Other Ambulatory Visit: Payer: Self-pay | Admitting: Internal Medicine

## 2015-01-04 HISTORY — PX: OTHER SURGICAL HISTORY: SHX169

## 2015-01-08 DIAGNOSIS — H1852 Epithelial (juvenile) corneal dystrophy: Secondary | ICD-10-CM | POA: Diagnosis not present

## 2015-01-08 DIAGNOSIS — K219 Gastro-esophageal reflux disease without esophagitis: Secondary | ICD-10-CM | POA: Diagnosis not present

## 2015-01-08 DIAGNOSIS — H43812 Vitreous degeneration, left eye: Secondary | ICD-10-CM | POA: Diagnosis not present

## 2015-01-08 DIAGNOSIS — H25012 Cortical age-related cataract, left eye: Secondary | ICD-10-CM | POA: Diagnosis not present

## 2015-01-08 DIAGNOSIS — Z79899 Other long term (current) drug therapy: Secondary | ICD-10-CM | POA: Diagnosis not present

## 2015-01-08 DIAGNOSIS — H25042 Posterior subcapsular polar age-related cataract, left eye: Secondary | ICD-10-CM | POA: Diagnosis not present

## 2015-01-08 DIAGNOSIS — E785 Hyperlipidemia, unspecified: Secondary | ICD-10-CM | POA: Diagnosis not present

## 2015-01-08 DIAGNOSIS — H2512 Age-related nuclear cataract, left eye: Secondary | ICD-10-CM | POA: Diagnosis not present

## 2015-01-08 DIAGNOSIS — I10 Essential (primary) hypertension: Secondary | ICD-10-CM | POA: Diagnosis not present

## 2015-01-23 DIAGNOSIS — M8580 Other specified disorders of bone density and structure, unspecified site: Secondary | ICD-10-CM | POA: Diagnosis not present

## 2015-01-23 DIAGNOSIS — Z6827 Body mass index (BMI) 27.0-27.9, adult: Secondary | ICD-10-CM | POA: Diagnosis not present

## 2015-01-23 DIAGNOSIS — K219 Gastro-esophageal reflux disease without esophagitis: Secondary | ICD-10-CM | POA: Diagnosis not present

## 2015-01-23 DIAGNOSIS — I10 Essential (primary) hypertension: Secondary | ICD-10-CM | POA: Diagnosis not present

## 2015-01-23 DIAGNOSIS — E785 Hyperlipidemia, unspecified: Secondary | ICD-10-CM | POA: Diagnosis not present

## 2015-01-23 DIAGNOSIS — M25562 Pain in left knee: Secondary | ICD-10-CM | POA: Diagnosis not present

## 2015-01-23 DIAGNOSIS — H3531 Nonexudative age-related macular degeneration: Secondary | ICD-10-CM | POA: Diagnosis not present

## 2015-01-29 DIAGNOSIS — Z9842 Cataract extraction status, left eye: Secondary | ICD-10-CM | POA: Diagnosis not present

## 2015-01-29 DIAGNOSIS — H2511 Age-related nuclear cataract, right eye: Secondary | ICD-10-CM | POA: Diagnosis not present

## 2015-01-29 DIAGNOSIS — H25011 Cortical age-related cataract, right eye: Secondary | ICD-10-CM | POA: Diagnosis not present

## 2015-01-29 DIAGNOSIS — Z79899 Other long term (current) drug therapy: Secondary | ICD-10-CM | POA: Diagnosis not present

## 2015-01-29 DIAGNOSIS — I10 Essential (primary) hypertension: Secondary | ICD-10-CM | POA: Diagnosis not present

## 2015-01-29 DIAGNOSIS — H1852 Epithelial (juvenile) corneal dystrophy: Secondary | ICD-10-CM | POA: Diagnosis not present

## 2015-01-29 DIAGNOSIS — K219 Gastro-esophageal reflux disease without esophagitis: Secondary | ICD-10-CM | POA: Diagnosis not present

## 2015-01-29 DIAGNOSIS — H25041 Posterior subcapsular polar age-related cataract, right eye: Secondary | ICD-10-CM | POA: Diagnosis not present

## 2015-01-29 DIAGNOSIS — Z961 Presence of intraocular lens: Secondary | ICD-10-CM | POA: Diagnosis not present

## 2015-01-30 DIAGNOSIS — Z961 Presence of intraocular lens: Secondary | ICD-10-CM | POA: Insufficient documentation

## 2015-01-30 DIAGNOSIS — Z9841 Cataract extraction status, right eye: Secondary | ICD-10-CM | POA: Insufficient documentation

## 2015-06-01 ENCOUNTER — Other Ambulatory Visit: Payer: Self-pay | Admitting: Internal Medicine

## 2015-06-01 DIAGNOSIS — Z1231 Encounter for screening mammogram for malignant neoplasm of breast: Secondary | ICD-10-CM

## 2015-06-24 ENCOUNTER — Ambulatory Visit (INDEPENDENT_AMBULATORY_CARE_PROVIDER_SITE_OTHER): Payer: Medicare Other

## 2015-06-24 DIAGNOSIS — Z1231 Encounter for screening mammogram for malignant neoplasm of breast: Secondary | ICD-10-CM | POA: Diagnosis not present

## 2015-07-31 DIAGNOSIS — E785 Hyperlipidemia, unspecified: Secondary | ICD-10-CM | POA: Diagnosis not present

## 2015-07-31 DIAGNOSIS — M859 Disorder of bone density and structure, unspecified: Secondary | ICD-10-CM | POA: Diagnosis not present

## 2015-07-31 DIAGNOSIS — I10 Essential (primary) hypertension: Secondary | ICD-10-CM | POA: Diagnosis not present

## 2015-07-31 DIAGNOSIS — E538 Deficiency of other specified B group vitamins: Secondary | ICD-10-CM | POA: Diagnosis not present

## 2015-08-11 DIAGNOSIS — Z1212 Encounter for screening for malignant neoplasm of rectum: Secondary | ICD-10-CM | POA: Diagnosis not present

## 2015-09-15 DIAGNOSIS — L80 Vitiligo: Secondary | ICD-10-CM | POA: Diagnosis not present

## 2015-09-15 DIAGNOSIS — D1801 Hemangioma of skin and subcutaneous tissue: Secondary | ICD-10-CM | POA: Diagnosis not present

## 2015-09-15 DIAGNOSIS — L821 Other seborrheic keratosis: Secondary | ICD-10-CM | POA: Diagnosis not present

## 2015-09-15 DIAGNOSIS — L820 Inflamed seborrheic keratosis: Secondary | ICD-10-CM | POA: Diagnosis not present

## 2015-11-16 ENCOUNTER — Encounter: Payer: Self-pay | Admitting: *Deleted

## 2015-11-16 ENCOUNTER — Emergency Department (INDEPENDENT_AMBULATORY_CARE_PROVIDER_SITE_OTHER)
Admission: EM | Admit: 2015-11-16 | Discharge: 2015-11-16 | Disposition: A | Discharge status: Home / Self Care | Payer: Medicare Other | Source: Home / Self Care | Attending: Family Medicine | Admitting: Family Medicine

## 2015-11-16 DIAGNOSIS — J111 Influenza due to unidentified influenza virus with other respiratory manifestations: Secondary | ICD-10-CM | POA: Diagnosis not present

## 2015-11-16 DIAGNOSIS — R69 Illness, unspecified: Principal | ICD-10-CM

## 2015-11-16 MED ORDER — BENZONATATE 200 MG PO CAPS: 200.0000 mg | ORAL_CAPSULE | Freq: Every day | ORAL | Status: DC

## 2015-11-16 MED ORDER — OSELTAMIVIR PHOSPHATE 75 MG PO CAPS: 75.0000 mg | ORAL_CAPSULE | Freq: Two times a day (BID) | ORAL | Status: DC

## 2015-11-16 NOTE — ED Provider Notes (Signed)
CSN: [id number]     Arrival date & time 11/16/15  0856 History   First MD Initiated Contact with Patient 11/16/15 [phone]     Chief Complaint  Patient presents with  . Cough      HPI Comments: Complains of 3 day history flu-like illness including myalgias, headache, fever/chills, fatigue, and cough.  She has minimal nasal congestion and sore throat.  Cough is non-productive and somewhat worse at night.  No pleuritic pain or shortness of breath.  She has had a flu shot this season.     The history is provided by the patient.    Past Medical History  Diagnosis Date  . Hyperlipidemia   . Posterior vitreous detachment   . Osteopenia   . Hypertension   . Vitamin B12 deficiency   . Insomnia   . Depression with anxiety   . GERD (gastroesophageal reflux disease)   . Vitamin D insufficiency   . Osteoarthritis   . Anal fissure   . Diverticulitis of colon     ? 2011  . Esophageal stricture   . Tubular adenoma polyp of rectum    Past Surgical History  Procedure Laterality Date  . Rotator cuff repair      x 2  . Total abdominal hysterectomy w/ bilateral salpingoophorectomy    . Wisdom tooth extraction    . Appendectomy     Family History  Problem Relation Age of Onset  . COPD Mother   . Gout    . Diabetes type II    . Stroke    . Breast cancer      grandmother  . Colon cancer Neg Hx    Social History  Substance Use Topics  . Smoking status: Never Smoker   . Smokeless tobacco: Never Used  . Alcohol Use: No   OB History    Gravida Para Term Preterm AB TAB SAB Ectopic Multiple Living   2 2        2      Review of Systems No sore throat + hoarse + cough No pleuritic pain No wheezing + nasal congestion + post-nasal drainage No sinus pain/pressure No itchy/red eyes No earache No hemoptysis No SOB ? fever, + chills No nausea No vomiting No abdominal pain No diarrhea No urinary symptoms No skin rash + fatigue +myalgias + headache Used OTC meds without relief   Allergies  Review of patient's allergies indicates no known allergies.  Home Medications   Prior to Admission medications   Medication Sig Start Date End Date Taking? Authorizing Provider  alendronate (FOSAMAX) 70 MG tablet Take 70 mg by mouth once a week. Take with a full glass of water on an empty stomach.   Yes Historical Provider, MD  ALPRAZolam Duanne Moron) 0.5 MG tablet Take 0.5 mg by mouth at bedtime as needed. 1/2-1 at bedtime    Yes Historical Provider, MD  Cholecalciferol (VITAMIN D3) 2000 UNITS TABS Take by mouth.   Yes Historical Provider, MD  Cyanocobalamin (VITAMIN B 12 PO) Take 2,500 mcg by mouth daily.    Yes Historical Provider, MD  lisinopril (PRINIVIL,ZESTRIL) 40 MG tablet Take 40 mg by mouth daily.     Yes Historical Provider, MD  Omega-3 Fatty Acids (FISH OIL TRIPLE STRENGTH) 1400 MG CAPS Take 1 capsule by mouth daily.     Yes Historical Provider, MD  simvastatin (ZOCOR) 20 MG tablet Take 20 mg by mouth at bedtime.     Yes Historical Provider, MD  benzonatate (TESSALON) 200 MG  capsule Take 1 capsule (200 mg total) by mouth at bedtime. Take as needed for cough 11/16/15   Kandra Nicolas, MD  cycloSPORINE (RESTASIS) 0.05 % ophthalmic emulsion 1 drop 2 (two) times daily.    Historical Provider, MD  docusate sodium (COLACE) 100 MG capsule Take 100 mg by mouth daily.    Historical Provider, MD  oseltamivir (TAMIFLU) 75 MG capsule Take 1 capsule (75 mg total) by mouth every 12 (twelve) hours. 11/16/15   Kandra Nicolas, MD  Probiotic Product (PROBIOTIC DAILY PO) Take 1 capsule by mouth daily.    Historical Provider, MD   Meds Ordered and Administered this Visit  Medications - No data to display  BP 114/76 mmHg  Pulse 102  Temp(Src) 99.9 F (37.7 C) (Oral)  Resp 16  Wt 180 lb (81.647 kg)  SpO2 94% No data found.   Physical Exam Nursing notes and Vital Signs reviewed. Appearance:  Patient appears stated age, and in no acute distress Eyes:  Pupils are equal, round, and  reactive to light and accomodation.  Extraocular movement is intact.  Conjunctivae are not inflamed  Ears:  Canals normal.  Tympanic membranes normal.  Nose:  Mildly congested turbinates.  No sinus tenderness.    Pharynx:  Normal Neck:  Supple.  Tender enlarged posterior nodes are palpated bilaterally  Lungs:  Clear to auscultation.  Breath sounds are equal.  Moving air well. Heart:  Regular rate and rhythm without murmurs, rubs, or gallops.  Abdomen:  Nontender without masses or hepatosplenomegaly.  Bowel sounds are present.  No CVA or flank tenderness.  Extremities:  No edema.  Skin:  No rash present.   ED Course  Procedures  None    MDM   1. Influenza-like illness    Begin Tamiflu.  Prescription written for Benzonatate [facility]) to take at bedtime for night-time cough.  Begin prophylactic Tamiflu for her husband Sanika Marsalis. Take plain guaifenesin (1200mg  extended release tabs such as Mucinex) twice daily, with plenty of water, for cough and congestion.  Get adequate rest.   For nasal congestion may use Afrin nasal spray (or generic oxymetazoline) twice daily for about 5 days and then discontinue.  Also recommend using saline nasal spray several times daily and saline nasal irrigation (AYR is a common brand).   Try warm salt water gargles for sore throat.  Stop all antihistamines for now, and other non-prescription cough/cold preparations. May take Ibuprofen 200mg , 4 tabs every 8 hours with food for body aches, headache, etc.   Follow-up with family doctor if not improving about one week.    Kandra Nicolas, MD 11/16/15 [phone]

## 2015-11-16 NOTE — ED Notes (Signed)
Pt c/o cough x 3 days, chills, low grade fever, aches and congestion x 2 days. Received flu vaccine this season. Cough is productive in the AM, dry all day. Taken Motrin and old Benzonatate Rx.

## 2015-11-16 NOTE — Discharge Instructions (Signed)
Take plain guaifenesin (1200mg  extended release tabs such as Mucinex) twice daily, with plenty of water, for cough and congestion.  Get adequate rest.   For nasal congestion may use Afrin nasal spray (or generic oxymetazoline) twice daily for about 5 days and then discontinue.  Also recommend using saline nasal spray several times daily and saline nasal irrigation (AYR is a common brand).   Try warm salt water gargles for sore throat.  Stop all antihistamines for now, and other non-prescription cough/cold preparations. May take Ibuprofen 200mg , 4 tabs every 8 hours with food for body aches, headache, etc.   Follow-up with family doctor if not improving about one week.

## 2015-11-18 ENCOUNTER — Telehealth: Payer: Self-pay | Admitting: Emergency Medicine

## 2016-02-22 ENCOUNTER — Encounter: Payer: Self-pay | Admitting: Internal Medicine

## 2016-03-15 DIAGNOSIS — H43813 Vitreous degeneration, bilateral: Secondary | ICD-10-CM | POA: Diagnosis not present

## 2016-05-31 ENCOUNTER — Other Ambulatory Visit: Payer: Self-pay | Admitting: Internal Medicine

## 2016-05-31 DIAGNOSIS — Z1231 Encounter for screening mammogram for malignant neoplasm of breast: Secondary | ICD-10-CM

## 2016-05-31 DIAGNOSIS — Z9289 Personal history of other medical treatment: Secondary | ICD-10-CM

## 2016-06-09 DIAGNOSIS — Z23 Encounter for immunization: Secondary | ICD-10-CM | POA: Diagnosis not present

## 2016-06-28 ENCOUNTER — Ambulatory Visit (INDEPENDENT_AMBULATORY_CARE_PROVIDER_SITE_OTHER): Payer: Medicare Other

## 2016-06-28 DIAGNOSIS — Z1231 Encounter for screening mammogram for malignant neoplasm of breast: Secondary | ICD-10-CM

## 2016-08-02 DIAGNOSIS — Z0000 Encounter for general adult medical examination without abnormal findings: Secondary | ICD-10-CM | POA: Diagnosis not present

## 2016-08-02 DIAGNOSIS — M8580 Other specified disorders of bone density and structure, unspecified site: Secondary | ICD-10-CM | POA: Diagnosis not present

## 2016-08-02 DIAGNOSIS — R739 Hyperglycemia, unspecified: Secondary | ICD-10-CM | POA: Diagnosis not present

## 2016-08-02 DIAGNOSIS — R7309 Other abnormal glucose: Secondary | ICD-10-CM | POA: Diagnosis not present

## 2016-08-02 DIAGNOSIS — I10 Essential (primary) hypertension: Secondary | ICD-10-CM | POA: Diagnosis not present

## 2016-08-02 DIAGNOSIS — E785 Hyperlipidemia, unspecified: Secondary | ICD-10-CM | POA: Diagnosis not present

## 2016-08-02 DIAGNOSIS — E784 Other hyperlipidemia: Secondary | ICD-10-CM | POA: Diagnosis not present

## 2016-08-02 DIAGNOSIS — E538 Deficiency of other specified B group vitamins: Secondary | ICD-10-CM | POA: Diagnosis not present

## 2016-08-02 DIAGNOSIS — M859 Disorder of bone density and structure, unspecified: Secondary | ICD-10-CM | POA: Diagnosis not present

## 2016-10-10 DIAGNOSIS — Z1212 Encounter for screening for malignant neoplasm of rectum: Secondary | ICD-10-CM | POA: Diagnosis not present

## 2016-10-14 DIAGNOSIS — M859 Disorder of bone density and structure, unspecified: Secondary | ICD-10-CM | POA: Diagnosis not present

## 2017-01-24 DIAGNOSIS — M1711 Unilateral primary osteoarthritis, right knee: Secondary | ICD-10-CM | POA: Diagnosis not present

## 2017-01-24 DIAGNOSIS — M1712 Unilateral primary osteoarthritis, left knee: Secondary | ICD-10-CM | POA: Diagnosis not present

## 2017-02-21 DIAGNOSIS — M1712 Unilateral primary osteoarthritis, left knee: Secondary | ICD-10-CM | POA: Diagnosis not present

## 2017-02-21 DIAGNOSIS — M1711 Unilateral primary osteoarthritis, right knee: Secondary | ICD-10-CM | POA: Diagnosis not present

## 2017-03-16 DIAGNOSIS — H43813 Vitreous degeneration, bilateral: Secondary | ICD-10-CM | POA: Diagnosis not present

## 2017-03-16 DIAGNOSIS — H1859 Other hereditary corneal dystrophies: Secondary | ICD-10-CM | POA: Diagnosis not present

## 2017-03-16 DIAGNOSIS — H5203 Hypermetropia, bilateral: Secondary | ICD-10-CM | POA: Diagnosis not present

## 2017-03-16 DIAGNOSIS — H524 Presbyopia: Secondary | ICD-10-CM | POA: Diagnosis not present

## 2017-03-16 DIAGNOSIS — Z961 Presence of intraocular lens: Secondary | ICD-10-CM | POA: Diagnosis not present

## 2017-03-16 DIAGNOSIS — H52223 Regular astigmatism, bilateral: Secondary | ICD-10-CM | POA: Diagnosis not present

## 2017-03-27 DIAGNOSIS — I951 Orthostatic hypotension: Secondary | ICD-10-CM | POA: Diagnosis not present

## 2017-03-27 DIAGNOSIS — I10 Essential (primary) hypertension: Secondary | ICD-10-CM | POA: Diagnosis not present

## 2017-03-27 DIAGNOSIS — Z6828 Body mass index (BMI) 28.0-28.9, adult: Secondary | ICD-10-CM | POA: Diagnosis not present

## 2017-03-27 DIAGNOSIS — H8110 Benign paroxysmal vertigo, unspecified ear: Secondary | ICD-10-CM | POA: Diagnosis not present

## 2017-03-28 DIAGNOSIS — H8110 Benign paroxysmal vertigo, unspecified ear: Secondary | ICD-10-CM | POA: Diagnosis not present

## 2017-03-28 DIAGNOSIS — R2689 Other abnormalities of gait and mobility: Secondary | ICD-10-CM | POA: Diagnosis not present

## 2017-03-29 DIAGNOSIS — R2689 Other abnormalities of gait and mobility: Secondary | ICD-10-CM | POA: Diagnosis not present

## 2017-03-29 DIAGNOSIS — H81392 Other peripheral vertigo, left ear: Secondary | ICD-10-CM | POA: Diagnosis not present

## 2017-04-03 DIAGNOSIS — H81392 Other peripheral vertigo, left ear: Secondary | ICD-10-CM | POA: Diagnosis not present

## 2017-04-03 DIAGNOSIS — R2689 Other abnormalities of gait and mobility: Secondary | ICD-10-CM | POA: Diagnosis not present

## 2017-04-12 DIAGNOSIS — R2689 Other abnormalities of gait and mobility: Secondary | ICD-10-CM | POA: Diagnosis not present

## 2017-04-12 DIAGNOSIS — H81392 Other peripheral vertigo, left ear: Secondary | ICD-10-CM | POA: Diagnosis not present

## 2017-04-27 DIAGNOSIS — H52222 Regular astigmatism, left eye: Secondary | ICD-10-CM | POA: Diagnosis not present

## 2017-04-27 DIAGNOSIS — H5202 Hypermetropia, left eye: Secondary | ICD-10-CM | POA: Diagnosis not present

## 2017-04-27 DIAGNOSIS — Z961 Presence of intraocular lens: Secondary | ICD-10-CM | POA: Diagnosis not present

## 2017-04-27 DIAGNOSIS — H26492 Other secondary cataract, left eye: Secondary | ICD-10-CM | POA: Diagnosis not present

## 2017-04-27 DIAGNOSIS — H43813 Vitreous degeneration, bilateral: Secondary | ICD-10-CM | POA: Diagnosis not present

## 2017-04-27 DIAGNOSIS — H1859 Other hereditary corneal dystrophies: Secondary | ICD-10-CM | POA: Diagnosis not present

## 2017-05-15 DIAGNOSIS — H527 Unspecified disorder of refraction: Secondary | ICD-10-CM | POA: Diagnosis not present

## 2017-05-15 DIAGNOSIS — H1859 Other hereditary corneal dystrophies: Secondary | ICD-10-CM | POA: Diagnosis not present

## 2017-05-15 DIAGNOSIS — H02834 Dermatochalasis of left upper eyelid: Secondary | ICD-10-CM | POA: Diagnosis not present

## 2017-05-15 DIAGNOSIS — H26493 Other secondary cataract, bilateral: Secondary | ICD-10-CM | POA: Diagnosis not present

## 2017-05-15 DIAGNOSIS — H02831 Dermatochalasis of right upper eyelid: Secondary | ICD-10-CM | POA: Diagnosis not present

## 2017-06-06 DIAGNOSIS — M1712 Unilateral primary osteoarthritis, left knee: Secondary | ICD-10-CM | POA: Diagnosis not present

## 2017-06-06 DIAGNOSIS — M1711 Unilateral primary osteoarthritis, right knee: Secondary | ICD-10-CM | POA: Diagnosis not present

## 2017-06-08 ENCOUNTER — Other Ambulatory Visit: Payer: Self-pay | Admitting: Internal Medicine

## 2017-06-08 DIAGNOSIS — Z9842 Cataract extraction status, left eye: Secondary | ICD-10-CM | POA: Diagnosis not present

## 2017-06-08 DIAGNOSIS — H52223 Regular astigmatism, bilateral: Secondary | ICD-10-CM | POA: Diagnosis not present

## 2017-06-08 DIAGNOSIS — Z1231 Encounter for screening mammogram for malignant neoplasm of breast: Secondary | ICD-10-CM

## 2017-06-08 DIAGNOSIS — H1859 Other hereditary corneal dystrophies: Secondary | ICD-10-CM | POA: Diagnosis not present

## 2017-06-08 DIAGNOSIS — H5203 Hypermetropia, bilateral: Secondary | ICD-10-CM | POA: Diagnosis not present

## 2017-06-08 DIAGNOSIS — H26491 Other secondary cataract, right eye: Secondary | ICD-10-CM | POA: Diagnosis not present

## 2017-06-08 DIAGNOSIS — H524 Presbyopia: Secondary | ICD-10-CM | POA: Diagnosis not present

## 2017-06-08 DIAGNOSIS — H43813 Vitreous degeneration, bilateral: Secondary | ICD-10-CM | POA: Diagnosis not present

## 2017-06-08 DIAGNOSIS — Z961 Presence of intraocular lens: Secondary | ICD-10-CM | POA: Diagnosis not present

## 2017-06-12 DIAGNOSIS — Z23 Encounter for immunization: Secondary | ICD-10-CM | POA: Diagnosis not present

## 2017-06-28 ENCOUNTER — Encounter: Payer: Self-pay | Admitting: Internal Medicine

## 2017-07-12 ENCOUNTER — Ambulatory Visit (INDEPENDENT_AMBULATORY_CARE_PROVIDER_SITE_OTHER): Payer: Medicare Other

## 2017-07-12 DIAGNOSIS — Z1231 Encounter for screening mammogram for malignant neoplasm of breast: Secondary | ICD-10-CM

## 2017-08-07 DIAGNOSIS — L80 Vitiligo: Secondary | ICD-10-CM | POA: Diagnosis not present

## 2017-08-07 DIAGNOSIS — D1801 Hemangioma of skin and subcutaneous tissue: Secondary | ICD-10-CM | POA: Diagnosis not present

## 2017-08-07 DIAGNOSIS — L821 Other seborrheic keratosis: Secondary | ICD-10-CM | POA: Diagnosis not present

## 2017-08-21 ENCOUNTER — Ambulatory Visit (AMBULATORY_SURGERY_CENTER): Payer: Self-pay | Admitting: *Deleted

## 2017-08-21 ENCOUNTER — Other Ambulatory Visit: Payer: Self-pay

## 2017-08-21 VITALS — Ht 66.0 in | Wt 184.2 lb

## 2017-08-21 DIAGNOSIS — Z86010 Personal history of colonic polyps: Secondary | ICD-10-CM

## 2017-08-21 MED ORDER — SUPREP BOWEL PREP KIT 17.5-3.13-1.6 GM/177ML PO SOLN: 1.0000 | Freq: Once | ORAL | 0 refills | Status: AC

## 2017-08-21 NOTE — Progress Notes (Signed)
Patient denies any allergies to egg or soy products. Patient denies complications with anesthesia/sedation.  Patient denies oxygen use at home and denies diet medications. Colonoscopy pamphlet given to patient.   

## 2017-09-04 ENCOUNTER — Encounter: Payer: Self-pay | Admitting: Internal Medicine

## 2017-09-04 ENCOUNTER — Ambulatory Visit (AMBULATORY_SURGERY_CENTER): Payer: Medicare Other | Admitting: Internal Medicine

## 2017-09-04 ENCOUNTER — Other Ambulatory Visit: Payer: Self-pay

## 2017-09-04 VITALS — BP 94/60 | HR 75 | Temp 96.8°F | Resp 14 | Ht 66.0 in | Wt 184.0 lb

## 2017-09-04 DIAGNOSIS — D120 Benign neoplasm of cecum: Secondary | ICD-10-CM

## 2017-09-04 DIAGNOSIS — Z86010 Personal history of colonic polyps: Secondary | ICD-10-CM | POA: Diagnosis not present

## 2017-09-04 DIAGNOSIS — Z1211 Encounter for screening for malignant neoplasm of colon: Secondary | ICD-10-CM | POA: Diagnosis not present

## 2017-09-04 MED ORDER — SODIUM CHLORIDE 0.9 % IV SOLN: 500.0000 mL | Freq: Once | INTRAVENOUS | Status: DC

## 2017-09-04 NOTE — Op Note (Signed)
[facility] Patient Name: Evony Rezek Procedure Date: 09/04/2017 9:53 AM MRN: [id number] Endoscopist: Docia Chuck. Henrene Pastor , MD Age: 76 Referring MD:  Date of Birth: 07-Oct-1940 Gender: Female Account #: [id number] Procedure:                Colonoscopy with cold snare polypectomy x1 Indications:              High risk colon cancer surveillance: Personal                            history of non-advanced adenoma. Most recent                            examination June 2012. Examinations prior to that                            elsewhere Medicines:                Monitored Anesthesia Care Procedure:                Pre-Anesthesia Assessment:                           - Prior to the procedure, a History and Physical                            was performed, and patient medications and                            allergies were reviewed. The patient's tolerance of                            previous anesthesia was also reviewed. The risks                            and benefits of the procedure and the sedation                            options and risks were discussed with the patient.                            All questions were answered, and informed consent                            was obtained. Prior Anticoagulants: The patient has                            taken no previous anticoagulant or antiplatelet                            agents. ASA Grade Assessment: II - A patient with                            mild systemic disease. After reviewing the risks  and benefits, the patient was deemed in                            satisfactory condition to undergo the procedure.                           After obtaining informed consent, the colonoscope                            was passed under direct vision. Throughout the                            procedure, the patient's blood pressure, pulse, and                            oxygen saturations were  monitored continuously. The                            Model CF-HQ190L [id number]) scope was introduced                            through the anus and advanced to the the cecum,                            identified by appendiceal orifice and ileocecal                            valve. The ileocecal valve, appendiceal orifice,                            and rectum were photographed. The quality of the                            bowel preparation was excellent. The colonoscopy                            was performed without difficulty. The patient                            tolerated the procedure well. The bowel preparation                            used was SUPREP. Scope In: 10:01:15 AM Scope Out: 10:13:48 AM Scope Withdrawal Time: 0 hours 10 minutes 42 seconds  Total Procedure Duration: 0 hours 12 minutes 33 seconds  Findings:                 A 1 mm polyp was found in the cecum. The polyp was                            removed with a cold snare. Resection and retrieval                            were complete.  Multiple diverticula were found in the left colon.                           The exam was otherwise without abnormality on                            direct and retroflexion views. Complications:            No immediate complications. Estimated blood loss:                            None. Estimated Blood Loss:     Estimated blood loss: none. Impression:               - One 1 mm polyp in the cecum, removed with a cold                            snare. Resected and retrieved.                           - Diverticulosis in the left colon.                           - The examination was otherwise normal on direct                            and retroflexion views. Recommendation:           - Repeat colonoscopy is not recommended for                            surveillance, given age, personal history, and                            findings on today's  exam.                           - Patient has a contact number available for                            emergencies. The signs and symptoms of potential                            delayed complications were discussed with the                            patient. Return to normal activities tomorrow.                            Written discharge instructions were provided to the                            patient.                           - Resume previous diet.                           -  Continue present medications.                           - Await pathology results. Docia Chuck. Henrene Pastor, MD 09/04/2017 10:19:49 AM This report has been signed electronically.

## 2017-09-04 NOTE — Progress Notes (Signed)
A/ox3 pleased with MAC, report to Angela RN 

## 2017-09-04 NOTE — Progress Notes (Signed)
Called to room to assist during endoscopic procedure.  Patient ID and intended procedure confirmed with present staff. Received instructions for my participation in the procedure from the performing physician.  

## 2017-09-04 NOTE — Progress Notes (Signed)
Pt's states no medical or surgical changes since previsit or office visit. 

## 2017-09-04 NOTE — Patient Instructions (Addendum)
**Handout given to patient on polyps and diverticulosis**    YOU HAD AN ENDOSCOPIC PROCEDURE TODAY AT [facility]:   Refer to the procedure report that was given to you for any specific questions about what was found during the examination.  If the procedure report does not answer your questions, please call your gastroenterologist to clarify.  If you requested that your care partner not be given the details of your procedure findings, then the procedure report has been included in a sealed envelope for you to review at your convenience later.  YOU SHOULD EXPECT: Some feelings of bloating in the abdomen. Passage of more gas than usual.  Walking can help get rid of the air that was put into your GI tract during the procedure and reduce the bloating. If you had a lower endoscopy (such as a colonoscopy or flexible sigmoidoscopy) you may notice spotting of blood in your stool or on the toilet paper. If you underwent a bowel prep for your procedure, you may not have a normal bowel movement for a few days.  Please Note:  You might notice some irritation and congestion in your nose or some drainage.  This is from the oxygen used during your procedure.  There is no need for concern and it should clear up in a day or so.  SYMPTOMS TO REPORT IMMEDIATELY:   Following lower endoscopy (colonoscopy or flexible sigmoidoscopy):  Excessive amounts of blood in the stool  Significant tenderness or worsening of abdominal pains  Swelling of the abdomen that is new, acute  Fever of 100F or higher   For urgent or emergent issues, a gastroenterologist can be reached at any hour by calling [phone].   DIET:  We do recommend a small meal at first, but then you may proceed to your regular diet.  Drink plenty of fluids but you should avoid alcoholic beverages for 24 hours.  ACTIVITY:  You should plan to take it easy for the rest of today and you should NOT DRIVE or use heavy machinery until  tomorrow (because of the sedation medicines used during the test).    FOLLOW UP: Our staff will call the number listed on your records the next business day following your procedure to check on you and address any questions or concerns that you may have regarding the information given to you following your procedure. If we do not reach you, we will leave a message.  However, if you are feeling well and you are not experiencing any problems, there is no need to return our call.  We will assume that you have returned to your regular daily activities without incident.  If any biopsies were taken you will be contacted by phone or by letter within the next 1-3 weeks.  Please call us at [phone] if you have not heard about the biopsies in 3 weeks.    SIGNATURES/CONFIDENTIALITY: You and/or your care partner have signed paperwork which will be entered into your electronic medical record.  These signatures attest to the fact that that the information above on your After Visit Summary has been reviewed and is understood.  Full responsibility of the confidentiality of this discharge information lies with you and/or your care-partner.YOU HAD AN ENDOSCOPIC PROCEDURE TODAY AT [facility]:   Refer to the procedure report that was given to you for any specific questions about what was found during the examination.  If the procedure report does not answer your questions, please call  your gastroenterologist to clarify.  If you requested that your care partner not be given the details of your procedure findings, then the procedure report has been included in a sealed envelope for you to review at your convenience later.  YOU SHOULD EXPECT: Some feelings of bloating in the abdomen. Passage of more gas than usual.  Walking can help get rid of the air that was put into your GI tract during the procedure and reduce the bloating. If you had a lower endoscopy (such as a colonoscopy or flexible  sigmoidoscopy) you may notice spotting of blood in your stool or on the toilet paper. If you underwent a bowel prep for your procedure, you may not have a normal bowel movement for a few days.  Please Note:  You might notice some irritation and congestion in your nose or some drainage.  This is from the oxygen used during your procedure.  There is no need for concern and it should clear up in a day or so.  SYMPTOMS TO REPORT IMMEDIATELY:   Following lower endoscopy (colonoscopy or flexible sigmoidoscopy):  Excessive amounts of blood in the stool  Significant tenderness or worsening of abdominal pains  Swelling of the abdomen that is new, acute  Fever of 100F or higher  For urgent or emergent issues, a gastroenterologist can be reached at any hour by calling [phone].   DIET:  We do recommend a small meal at first, but then you may proceed to your regular diet.  Drink plenty of fluids but you should avoid alcoholic beverages for 24 hours.  ACTIVITY:  You should plan to take it easy for the rest of today and you should NOT DRIVE or use heavy machinery until tomorrow (because of the sedation medicines used during the test).    FOLLOW UP: Our staff will call the number listed on your records the next business day following your procedure to check on you and address any questions or concerns that you may have regarding the information given to you following your procedure. If we do not reach you, we will leave a message.  However, if you are feeling well and you are not experiencing any problems, there is no need to return our call.  We will assume that you have returned to your regular daily activities without incident.  If any biopsies were taken you will be contacted by phone or by letter within the next 1-3 weeks.  Please call us at [phone] if you have not heard about the biopsies in 3 weeks.    SIGNATURES/CONFIDENTIALITY: You and/or your care partner have signed paperwork  which will be entered into your electronic medical record.  These signatures attest to the fact that that the information above on your After Visit Summary has been reviewed and is understood.  Full responsibility of the confidentiality of this discharge information lies with you and/or your care-partner.

## 2017-09-06 ENCOUNTER — Telehealth: Payer: Self-pay | Admitting: *Deleted

## 2017-09-06 NOTE — Telephone Encounter (Signed)
  Follow up Call-  Call back number 09/04/2017 09/04/2017  Post procedure Call Back phone  # [phone]  Permission to leave phone message Yes Yes  Some recent data might be hidden     Patient questions:  Do you have a fever, pain , or abdominal swelling? No. Pain Score  0 *  Have you tolerated food without any problems? Yes.    Have you been able to return to your normal activities? Yes.    Do you have any questions about your discharge instructions: Diet   No. Medications  No. Follow up visit  No.  Do you have questions or concerns about your Care? No.  Actions: * If pain score is 4 or above: No action needed, pain <4.

## 2017-09-07 ENCOUNTER — Encounter: Payer: Self-pay | Admitting: Internal Medicine

## 2017-09-08 DIAGNOSIS — R7309 Other abnormal glucose: Secondary | ICD-10-CM | POA: Diagnosis not present

## 2017-09-08 DIAGNOSIS — E538 Deficiency of other specified B group vitamins: Secondary | ICD-10-CM | POA: Diagnosis not present

## 2017-09-08 DIAGNOSIS — R82998 Other abnormal findings in urine: Secondary | ICD-10-CM | POA: Diagnosis not present

## 2017-09-08 DIAGNOSIS — M859 Disorder of bone density and structure, unspecified: Secondary | ICD-10-CM | POA: Diagnosis not present

## 2017-09-08 DIAGNOSIS — E7849 Other hyperlipidemia: Secondary | ICD-10-CM | POA: Diagnosis not present

## 2017-09-08 DIAGNOSIS — I10 Essential (primary) hypertension: Secondary | ICD-10-CM | POA: Diagnosis not present

## 2017-09-15 DIAGNOSIS — M1712 Unilateral primary osteoarthritis, left knee: Secondary | ICD-10-CM | POA: Diagnosis not present

## 2017-09-15 DIAGNOSIS — R350 Frequency of micturition: Secondary | ICD-10-CM | POA: Diagnosis not present

## 2017-09-15 DIAGNOSIS — R7309 Other abnormal glucose: Secondary | ICD-10-CM | POA: Diagnosis not present

## 2017-09-15 DIAGNOSIS — Z6829 Body mass index (BMI) 29.0-29.9, adult: Secondary | ICD-10-CM | POA: Diagnosis not present

## 2017-09-15 DIAGNOSIS — I10 Essential (primary) hypertension: Secondary | ICD-10-CM | POA: Diagnosis not present

## 2017-09-15 DIAGNOSIS — E7849 Other hyperlipidemia: Secondary | ICD-10-CM | POA: Diagnosis not present

## 2017-09-15 DIAGNOSIS — Z0000 Encounter for general adult medical examination without abnormal findings: Secondary | ICD-10-CM | POA: Diagnosis not present

## 2017-09-15 DIAGNOSIS — M1711 Unilateral primary osteoarthritis, right knee: Secondary | ICD-10-CM | POA: Diagnosis not present

## 2017-09-15 DIAGNOSIS — N183 Chronic kidney disease, stage 3 (moderate): Secondary | ICD-10-CM | POA: Diagnosis not present

## 2017-09-15 DIAGNOSIS — R0789 Other chest pain: Secondary | ICD-10-CM | POA: Diagnosis not present

## 2017-09-15 DIAGNOSIS — Z1389 Encounter for screening for other disorder: Secondary | ICD-10-CM | POA: Diagnosis not present

## 2017-09-15 DIAGNOSIS — I341 Nonrheumatic mitral (valve) prolapse: Secondary | ICD-10-CM | POA: Diagnosis not present

## 2017-09-26 DIAGNOSIS — M1712 Unilateral primary osteoarthritis, left knee: Secondary | ICD-10-CM | POA: Diagnosis not present

## 2017-09-26 DIAGNOSIS — M25561 Pain in right knee: Secondary | ICD-10-CM | POA: Diagnosis not present

## 2017-09-26 DIAGNOSIS — M25562 Pain in left knee: Secondary | ICD-10-CM | POA: Diagnosis not present

## 2017-09-26 DIAGNOSIS — M1711 Unilateral primary osteoarthritis, right knee: Secondary | ICD-10-CM | POA: Diagnosis not present

## 2017-10-05 DIAGNOSIS — M1712 Unilateral primary osteoarthritis, left knee: Secondary | ICD-10-CM | POA: Diagnosis not present

## 2017-10-05 DIAGNOSIS — M25561 Pain in right knee: Secondary | ICD-10-CM | POA: Diagnosis not present

## 2017-10-05 DIAGNOSIS — M1711 Unilateral primary osteoarthritis, right knee: Secondary | ICD-10-CM | POA: Diagnosis not present

## 2017-10-05 DIAGNOSIS — M25562 Pain in left knee: Secondary | ICD-10-CM | POA: Diagnosis not present

## 2017-10-12 DIAGNOSIS — M1711 Unilateral primary osteoarthritis, right knee: Secondary | ICD-10-CM | POA: Diagnosis not present

## 2017-10-12 DIAGNOSIS — M1712 Unilateral primary osteoarthritis, left knee: Secondary | ICD-10-CM | POA: Diagnosis not present

## 2017-10-19 DIAGNOSIS — M1711 Unilateral primary osteoarthritis, right knee: Secondary | ICD-10-CM | POA: Diagnosis not present

## 2017-10-19 DIAGNOSIS — M1712 Unilateral primary osteoarthritis, left knee: Secondary | ICD-10-CM | POA: Diagnosis not present

## 2017-11-16 DIAGNOSIS — M1711 Unilateral primary osteoarthritis, right knee: Secondary | ICD-10-CM | POA: Diagnosis not present

## 2017-11-16 DIAGNOSIS — M1712 Unilateral primary osteoarthritis, left knee: Secondary | ICD-10-CM | POA: Diagnosis not present

## 2017-12-04 ENCOUNTER — Encounter (HOSPITAL_COMMUNITY): Payer: Self-pay | Admitting: *Deleted

## 2017-12-11 ENCOUNTER — Other Ambulatory Visit: Payer: Self-pay | Admitting: Orthopedic Surgery

## 2017-12-27 ENCOUNTER — Encounter (HOSPITAL_COMMUNITY): Payer: Self-pay

## 2017-12-27 NOTE — Patient Instructions (Signed)
BAYLIE DRAKES  12/27/2017   Your procedure is scheduled on: 01/05/2018   Report to [facility] Main  Entrance  Report to admitting at    0530 AM    Call this number if you have problems the morning of surgery [phone]   Remember: Do not eat food or drink liquids :After Midnight.     Take these medicines the morning of surgery with A SIP OF WATER: Pepcide if needed, Claritins, eye drops as usual                                 You may not have any metal on your body including hair pins and              piercings  Do not wear jewelry, make-up, lotions, powders or perfumes, deodorant             Do not wear nail polish.  Do not shave  48 hours prior to surgery.               Do not bring valuables to the hospital. [place].  Contacts, dentures or bridgework may not be worn into surgery.  Leave suitcase in the car. After surgery it may be brought to your room.                       Please read over the following fact sheets you were given: _____________________________________________________________________             [facility] - Preparing for Surgery Before surgery, you can play an important role.  Because skin is not sterile, your skin needs to be as free of germs as possible.  You can reduce the number of germs on your skin by washing with CHG (chlorahexidine gluconate) soap before surgery.  CHG is an antiseptic cleaner which kills germs and bonds with the skin to continue killing germs even after washing. Please DO NOT use if you have an allergy to CHG or antibacterial soaps.  If your skin becomes reddened/irritated stop using the CHG and inform your nurse when you arrive at Short Stay. Do not shave (including legs and underarms) for at least 48 hours prior to the first CHG shower.  You may shave your face/neck. Please follow these instructions carefully:  1.  Shower with CHG Soap the night before  surgery and the  morning of Surgery.  2.  If you choose to wash your hair, wash your hair first as usual with your  normal  shampoo.  3.  After you shampoo, rinse your hair and body thoroughly to remove the  shampoo.                           4.  Use CHG as you would any other liquid soap.  You can apply chg directly  to the skin and wash                       Gently with a scrungie or clean washcloth.  5.  Apply the CHG Soap to your body ONLY FROM THE NECK DOWN.   Do not use on face/  open                           Wound or open sores. Avoid contact with eyes, ears mouth and genitals (private parts).                       Wash face,  Genitals (private parts) with your normal soap.             6.  Wash thoroughly, paying special attention to the area where your surgery  will be performed.  7.  Thoroughly rinse your body with warm water from the neck down.  8.  DO NOT shower/wash with your normal soap after using and rinsing off  the CHG Soap.                9.  Pat yourself dry with a clean towel.            10.  Wear clean pajamas.            11.  Place clean sheets on your bed the night of your first shower and do not  sleep with pets. Day of Surgery : Do not apply any lotions/deodorants the morning of surgery.  Please wear clean clothes to the hospital/surgery center.  FAILURE TO FOLLOW THESE INSTRUCTIONS MAY RESULT IN THE CANCELLATION OF YOUR SURGERY PATIENT SIGNATURE_________________________________  NURSE SIGNATURE__________________________________  ________________________________________________________________________  WHAT IS A BLOOD TRANSFUSION? Blood Transfusion Information  A transfusion is the replacement of blood or some of its parts. Blood is made up of multiple cells which provide different functions.  Red blood cells carry oxygen and are used for blood loss replacement.  White blood cells fight against infection.  Platelets control bleeding.  Plasma helps clot  blood.  Other blood products are available for specialized needs, such as hemophilia or other clotting disorders. BEFORE THE TRANSFUSION  Who gives blood for transfusions?   Healthy volunteers who are fully evaluated to make sure their blood is safe. This is blood bank blood. Transfusion therapy is the safest it has ever been in the practice of medicine. Before blood is taken from a donor, a complete history is taken to make sure that person has no history of diseases nor engages in risky social behavior (examples are intravenous drug use or sexual activity with multiple partners). The donor's travel history is screened to minimize risk of transmitting infections, such as malaria. The donated blood is tested for signs of infectious diseases, such as HIV and hepatitis. The blood is then tested to be sure it is compatible with you in order to minimize the chance of a transfusion reaction. If you or a relative donates blood, this is often done in anticipation of surgery and is not appropriate for emergency situations. It takes many days to process the donated blood. RISKS AND COMPLICATIONS Although transfusion therapy is very safe and saves many lives, the main dangers of transfusion include:   Getting an infectious disease.  Developing a transfusion reaction. This is an allergic reaction to something in the blood you were given. Every precaution is taken to prevent this. The decision to have a blood transfusion has been considered carefully by your caregiver before blood is given. Blood is not given unless the benefits outweigh the risks. AFTER THE TRANSFUSION  Right after receiving a blood transfusion, you will usually feel much better and more energetic. This is especially true if your red blood  cells have gotten low (anemic). The transfusion raises the level of the red blood cells which carry oxygen, and this usually causes an energy increase.  The nurse administering the transfusion will monitor  you carefully for complications. HOME CARE INSTRUCTIONS  No special instructions are needed after a transfusion. You may find your energy is better. Speak with your caregiver about any limitations on activity for underlying diseases you may have. SEEK MEDICAL CARE IF:   Your condition is not improving after your transfusion.  You develop redness or irritation at the intravenous (IV) site. SEEK IMMEDIATE MEDICAL CARE IF:  Any of the following symptoms occur over the next 12 hours:  Shaking chills.  You have a temperature by mouth above 102 F (38.9 C), not controlled by medicine.  Chest, back, or muscle pain.  People around you feel you are not acting correctly or are confused.  Shortness of breath or difficulty breathing.  Dizziness and fainting.  You get a rash or develop hives.  You have a decrease in urine output.  Your urine turns a dark color or changes to pink, red, or brown. Any of the following symptoms occur over the next 10 days:  You have a temperature by mouth above 102 F (38.9 C), not controlled by medicine.  Shortness of breath.  Weakness after normal activity.  The white part of the eye turns yellow (jaundice).  You have a decrease in the amount of urine or are urinating less often.  Your urine turns a dark color or changes to pink, red, or brown. Document Released: 08/19/2000 Document Revised: 11/14/2011 Document Reviewed: 04/07/2008 ExitCare Patient Information 2014 [place].  _______________________________________________________________________  Incentive Spirometer  An incentive spirometer is a tool that can help keep your lungs clear and active. This tool measures how well you are filling your lungs with each breath. Taking long deep breaths may help reverse or decrease the chance of developing breathing (pulmonary) problems (especially infection) following:  A long period of time when you are unable to move or be active. BEFORE THE  PROCEDURE   If the spirometer includes an indicator to show your best effort, your nurse or respiratory therapist will set it to a desired goal.  If possible, sit up straight or lean slightly forward. Try not to slouch.  Hold the incentive spirometer in an upright position. INSTRUCTIONS FOR USE  1. Sit on the edge of your bed if possible, or sit up as far as you can in bed or on a chair. 2. Hold the incentive spirometer in an upright position. 3. Breathe out normally. 4. Place the mouthpiece in your mouth and seal your lips tightly around it. 5. Breathe in slowly and as deeply as possible, raising the piston or the ball toward the top of the column. 6. Hold your breath for 3-5 seconds or for as long as possible. Allow the piston or ball to fall to the bottom of the column. 7. Remove the mouthpiece from your mouth and breathe out normally. 8. Rest for a few seconds and repeat Steps 1 through 7 at least 10 times every 1-2 hours when you are awake. Take your time and take a few normal breaths between deep breaths. 9. The spirometer may include an indicator to show your best effort. Use the indicator as a goal to work toward during each repetition. 10. After each set of 10 deep breaths, practice coughing to be sure your lungs are clear. If you have an incision (the cut made  at the time of surgery), support your incision when coughing by placing a pillow or rolled up towels firmly against it. Once you are able to get out of bed, walk around indoors and cough well. You may stop using the incentive spirometer when instructed by your caregiver.  RISKS AND COMPLICATIONS  Take your time so you do not get dizzy or light-headed.  If you are in pain, you may need to take or ask for pain medication before doing incentive spirometry. It is harder to take a deep breath if you are having pain. AFTER USE  Rest and breathe slowly and easily.  It can be helpful to keep track of a log of your progress. Your  caregiver can provide you with a simple table to help with this. If you are using the spirometer at home, follow these instructions: [place] IF:   You are having difficultly using the spirometer.  You have trouble using the spirometer as often as instructed.  Your pain medication is not giving enough relief while using the spirometer.  You develop fever of 100.5 F (38.1 C) or higher. SEEK IMMEDIATE MEDICAL CARE IF:   You cough up bloody sputum that had not been present before.  You develop fever of 102 F (38.9 C) or greater.  You develop worsening pain at or near the incision site. MAKE SURE YOU:   Understand these instructions.  Will watch your condition.  Will get help right away if you are not doing well or get worse. Document Released: 01/02/2007 Document Revised: 11/14/2011 Document Reviewed: 03/05/2007 [facility] Patient Information 2014 [place], [place].   ________________________________________________________________________

## 2017-12-28 ENCOUNTER — Other Ambulatory Visit: Payer: Self-pay

## 2017-12-28 ENCOUNTER — Encounter (HOSPITAL_COMMUNITY): Payer: Self-pay

## 2017-12-28 ENCOUNTER — Encounter (HOSPITAL_COMMUNITY)
Admission: RE | Admit: 2017-12-28 | Discharge: 2017-12-28 | Disposition: A | Discharge status: Home / Self Care | Payer: Medicare Other | Source: Ambulatory Visit | Attending: Orthopedic Surgery | Admitting: Orthopedic Surgery

## 2017-12-28 ENCOUNTER — Ambulatory Visit (HOSPITAL_COMMUNITY)
Admission: RE | Admit: 2017-12-28 | Discharge: 2017-12-28 | Disposition: A | Discharge status: Home / Self Care | Payer: Medicare Other | Source: Ambulatory Visit | Attending: Orthopedic Surgery | Admitting: Orthopedic Surgery

## 2017-12-28 DIAGNOSIS — Z01810 Encounter for preprocedural cardiovascular examination: Secondary | ICD-10-CM | POA: Diagnosis not present

## 2017-12-28 DIAGNOSIS — Z01812 Encounter for preprocedural laboratory examination: Secondary | ICD-10-CM | POA: Diagnosis not present

## 2017-12-28 DIAGNOSIS — Z01818 Encounter for other preprocedural examination: Secondary | ICD-10-CM | POA: Diagnosis not present

## 2017-12-28 DIAGNOSIS — I10 Essential (primary) hypertension: Secondary | ICD-10-CM | POA: Diagnosis not present

## 2017-12-28 LAB — URINALYSIS, ROUTINE W REFLEX MICROSCOPIC
BILIRUBIN URINE: NEGATIVE
Glucose, UA: NEGATIVE mg/dL
Hgb urine dipstick: NEGATIVE
KETONES UR: NEGATIVE mg/dL
Leukocytes, UA: NEGATIVE
NITRITE: NEGATIVE
PH: 7 (ref 5.0–8.0)
PROTEIN: NEGATIVE mg/dL
Specific Gravity, Urine: 1.003 — ABNORMAL LOW (ref 1.005–1.030)

## 2017-12-28 LAB — CBC WITH DIFFERENTIAL/PLATELET
BASOS ABS: 0 10*3/uL (ref 0.0–0.1)
Basophils Relative: 0 %
Eosinophils Absolute: 0.1 10*3/uL (ref 0.0–0.7)
Eosinophils Relative: 3 %
HEMATOCRIT: 41.8 % (ref 36.0–46.0)
HEMOGLOBIN: 14.2 g/dL (ref 12.0–15.0)
LYMPHS PCT: 35 %
Lymphs Abs: 1.4 10*3/uL (ref 0.7–4.0)
MCH: 30.1 pg (ref 26.0–34.0)
MCHC: 34 g/dL (ref 30.0–36.0)
MCV: 88.6 fL (ref 78.0–100.0)
MONO ABS: 0.5 10*3/uL (ref 0.1–1.0)
Monocytes Relative: 11 %
NEUTROS ABS: 2.1 10*3/uL (ref 1.7–7.7)
NEUTROS PCT: 51 %
Platelets: 226 10*3/uL (ref 150–400)
RBC: 4.72 MIL/uL (ref 3.87–5.11)
RDW: 12.6 % (ref 11.5–15.5)
WBC: 4 10*3/uL (ref 4.0–10.5)

## 2017-12-28 LAB — SURGICAL PCR SCREEN
MRSA, PCR: NEGATIVE
STAPHYLOCOCCUS AUREUS: NEGATIVE

## 2017-12-28 LAB — BASIC METABOLIC PANEL
ANION GAP: 11 (ref 5–15)
BUN: 21 mg/dL — ABNORMAL HIGH (ref 6–20)
CALCIUM: 9.5 mg/dL (ref 8.9–10.3)
CO2: 22 mmol/L (ref 22–32)
Chloride: 106 mmol/L (ref 101–111)
Creatinine, Ser: 0.89 mg/dL (ref 0.44–1.00)
GLUCOSE: 105 mg/dL — AB (ref 65–99)
POTASSIUM: 4.4 mmol/L (ref 3.5–5.1)
Sodium: 139 mmol/L (ref 135–145)

## 2017-12-28 LAB — APTT: aPTT: 27 seconds (ref 24–36)

## 2017-12-28 LAB — ABO/RH: ABO/RH(D): O POS

## 2017-12-28 NOTE — Progress Notes (Signed)
Clearance - Dr Virgina Jock - 12/01/17 on chart  EKG-11/15/17 - on chart

## 2017-12-29 ENCOUNTER — Other Ambulatory Visit: Payer: Self-pay | Admitting: Orthopedic Surgery

## 2017-12-29 NOTE — Care Plan (Signed)
I have spoken with patient prior to surgery and she plans to discharge to home with family and HHPT provided by [facility]. She has all needed equipment. SHe will follow up with Dr. Berenice Primas in the office on 01/15/18 @ 915 and transition to OPPT following that appointment at [facility]. She is aware and agreeable with this plan.   Please contact Ladell Heads, [place] with any questions or if this plan needs to change

## 2018-01-04 MED ORDER — TRANEXAMIC ACID 1000 MG/10ML IV SOLN
1000.0000 mg | INTRAVENOUS | Status: AC
  Administered 2018-01-05: 1000 mg via INTRAVENOUS
  Filled 2018-01-04: qty 1100

## 2018-01-04 NOTE — Anesthesia Preprocedure Evaluation (Addendum)
Anesthesia Evaluation  Patient identified by MRN, date of birth, ID band Patient awake    Reviewed: Allergy & Precautions, H&P , NPO status , Patient's Chart, lab work & pertinent test results, reviewed documented beta blocker date and time   Airway Mallampati: II  TM Distance: >3 FB Neck ROM: full    Dental no notable dental hx.    Pulmonary neg pulmonary ROS,    Pulmonary exam normal breath sounds clear to auscultation       Cardiovascular Exercise Tolerance: Good hypertension, Pt. on medications  Rhythm:regular Rate:Normal     Neuro/Psych Anxiety negative neurological ROS     GI/Hepatic Neg liver ROS, GERD  Medicated,  Endo/Other  negative endocrine ROS  Renal/GU negative Renal ROS  negative genitourinary   Musculoskeletal negative musculoskeletal ROS (+) Arthritis , Osteoarthritis,    Abdominal   Peds  Hematology negative hematology ROS (+)   Anesthesia Other Findings   Reproductive/Obstetrics negative OB ROS                             Anesthesia Physical Anesthesia Plan  ASA: II  Anesthesia Plan: Spinal and MAC   Post-op Pain Management:  Regional for Post-op pain   Induction:   PONV Risk Score and Plan: 3 and Treatment may vary due to age or medical condition and Propofol infusion  Airway Management Planned: Nasal Cannula and Natural Airway  Additional Equipment:   Intra-op Plan:   Post-operative Plan:   Informed Consent: I have reviewed the patients History and Physical, chart, labs and discussed the procedure including the risks, benefits and alternatives for the proposed anesthesia with the patient or authorized representative who has indicated his/her understanding and acceptance.   Dental Advisory Given  Plan Discussed with: CRNA, Surgeon and Anesthesiologist  Anesthesia Plan Comments: (  )       Anesthesia Quick Evaluation

## 2018-01-05 ENCOUNTER — Encounter (HOSPITAL_COMMUNITY)
Admission: RE | Disposition: A | Discharge status: Home Health | Payer: Self-pay | Source: Home / Self Care | Attending: Orthopedic Surgery

## 2018-01-05 ENCOUNTER — Encounter (HOSPITAL_COMMUNITY): Payer: Self-pay | Admitting: *Deleted

## 2018-01-05 ENCOUNTER — Inpatient Hospital Stay (HOSPITAL_COMMUNITY): Payer: Medicare Other | Admitting: Anesthesiology

## 2018-01-05 ENCOUNTER — Inpatient Hospital Stay (HOSPITAL_COMMUNITY)
Admission: RE | Admit: 2018-01-05 | Discharge: 2018-01-07 | DRG: 470 | Disposition: A | Discharge status: Home Health | Payer: Medicare Other | Attending: Orthopedic Surgery | Admitting: Orthopedic Surgery

## 2018-01-05 ENCOUNTER — Other Ambulatory Visit: Payer: Self-pay

## 2018-01-05 DIAGNOSIS — M8580 Other specified disorders of bone density and structure, unspecified site: Secondary | ICD-10-CM | POA: Diagnosis not present

## 2018-01-05 DIAGNOSIS — Z90710 Acquired absence of both cervix and uterus: Secondary | ICD-10-CM | POA: Diagnosis not present

## 2018-01-05 DIAGNOSIS — M1712 Unilateral primary osteoarthritis, left knee: Secondary | ICD-10-CM | POA: Diagnosis present

## 2018-01-05 DIAGNOSIS — K219 Gastro-esophageal reflux disease without esophagitis: Secondary | ICD-10-CM | POA: Diagnosis present

## 2018-01-05 DIAGNOSIS — E785 Hyperlipidemia, unspecified: Secondary | ICD-10-CM | POA: Diagnosis not present

## 2018-01-05 DIAGNOSIS — I10 Essential (primary) hypertension: Secondary | ICD-10-CM | POA: Diagnosis not present

## 2018-01-05 DIAGNOSIS — M25562 Pain in left knee: Secondary | ICD-10-CM | POA: Diagnosis not present

## 2018-01-05 DIAGNOSIS — M170 Bilateral primary osteoarthritis of knee: Principal | ICD-10-CM | POA: Diagnosis present

## 2018-01-05 DIAGNOSIS — G8918 Other acute postprocedural pain: Secondary | ICD-10-CM | POA: Diagnosis not present

## 2018-01-05 HISTORY — PX: TOTAL KNEE ARTHROPLASTY: SHX125

## 2018-01-05 LAB — TYPE AND SCREEN
ABO/RH(D): O POS
Antibody Screen: NEGATIVE

## 2018-01-05 SURGERY — ARTHROPLASTY, KNEE, TOTAL
Anesthesia: Monitor Anesthesia Care | Site: Knee | Laterality: Left

## 2018-01-05 MED ORDER — SODIUM CHLORIDE 0.9 % IR SOLN
Status: DC | PRN
  Administered 2018-01-05: 1000 mL

## 2018-01-05 MED ORDER — MORPHINE SULFATE (PF) 2 MG/ML IV SOLN
0.5000 mg | INTRAVENOUS | Status: DC | PRN
  Administered 2018-01-05: 1 mg via INTRAVENOUS
  Filled 2018-01-05: qty 1

## 2018-01-05 MED ORDER — DEXAMETHASONE SODIUM PHOSPHATE 10 MG/ML IJ SOLN
INTRAMUSCULAR | Status: AC
  Filled 2018-01-05: qty 1

## 2018-01-05 MED ORDER — METHOCARBAMOL 500 MG PO TABS
500.0000 mg | ORAL_TABLET | Freq: Four times a day (QID) | ORAL | Status: DC | PRN
  Administered 2018-01-07: 500 mg via ORAL
  Filled 2018-01-05: qty 1

## 2018-01-05 MED ORDER — BUPIVACAINE-EPINEPHRINE (PF) 0.5% -1:200000 IJ SOLN
INTRAMUSCULAR | Status: AC
  Filled 2018-01-05: qty 30

## 2018-01-05 MED ORDER — DEXAMETHASONE SODIUM PHOSPHATE 10 MG/ML IJ SOLN
INTRAMUSCULAR | Status: DC | PRN
  Administered 2018-01-05: 10 mg via INTRAVENOUS

## 2018-01-05 MED ORDER — BUPIVACAINE IN DEXTROSE 0.75-8.25 % IT SOLN
INTRATHECAL | Status: DC | PRN
  Administered 2018-01-05: 1.8 mL via INTRATHECAL

## 2018-01-05 MED ORDER — ALUM & MAG HYDROXIDE-SIMETH 200-200-20 MG/5ML PO SUSP: 30.0000 mL | ORAL | Status: DC | PRN

## 2018-01-05 MED ORDER — BUPIVACAINE LIPOSOME 1.3 % IJ SUSP
INTRAMUSCULAR | Status: DC | PRN
  Administered 2018-01-05: 20 mL

## 2018-01-05 MED ORDER — HYDROCODONE-ACETAMINOPHEN 10-325 MG PO TABS: 1.0000 | ORAL_TABLET | Freq: Four times a day (QID) | ORAL | 0 refills | Status: DC | PRN

## 2018-01-05 MED ORDER — CEFAZOLIN SODIUM-DEXTROSE 2-4 GM/100ML-% IV SOLN
2.0000 g | Freq: Four times a day (QID) | INTRAVENOUS | Status: AC
  Administered 2018-01-05 (×2): 2 g via INTRAVENOUS
  Filled 2018-01-05 (×2): qty 100

## 2018-01-05 MED ORDER — ASPIRIN EC 325 MG PO TBEC
325.0000 mg | DELAYED_RELEASE_TABLET | Freq: Two times a day (BID) | ORAL | Status: DC
  Administered 2018-01-05 – 2018-01-07 (×4): 325 mg via ORAL
  Filled 2018-01-05 (×5): qty 1

## 2018-01-05 MED ORDER — ROPIVACAINE HCL 7.5 MG/ML IJ SOLN
INTRAMUSCULAR | Status: DC | PRN
  Administered 2018-01-05: 25 mL via PERINEURAL

## 2018-01-05 MED ORDER — TIZANIDINE HCL 2 MG PO TABS: 2.0000 mg | ORAL_TABLET | Freq: Three times a day (TID) | ORAL | 0 refills | Status: DC | PRN

## 2018-01-05 MED ORDER — DOCUSATE SODIUM 100 MG PO CAPS: 100.0000 mg | ORAL_CAPSULE | Freq: Two times a day (BID) | ORAL | 0 refills | Status: DC

## 2018-01-05 MED ORDER — FAMOTIDINE 20 MG PO TABS: 20.0000 mg | ORAL_TABLET | Freq: Every day | ORAL | Status: DC | PRN

## 2018-01-05 MED ORDER — PHENYLEPHRINE HCL 10 MG/ML IJ SOLN: 30.0000 ug/min | INTRAVENOUS | Status: DC

## 2018-01-05 MED ORDER — BISACODYL 5 MG PO TBEC: 5.0000 mg | DELAYED_RELEASE_TABLET | Freq: Every day | ORAL | Status: DC | PRN

## 2018-01-05 MED ORDER — MEPERIDINE HCL 50 MG/ML IJ SOLN: 6.2500 mg | INTRAMUSCULAR | Status: DC | PRN

## 2018-01-05 MED ORDER — METHOCARBAMOL 1000 MG/10ML IJ SOLN
500.0000 mg | Freq: Four times a day (QID) | INTRAVENOUS | Status: DC | PRN
  Administered 2018-01-05: 500 mg via INTRAVENOUS
  Filled 2018-01-05: qty 550

## 2018-01-05 MED ORDER — SODIUM CHLORIDE 0.9 % IV SOLN
INTRAVENOUS | Status: DC
  Administered 2018-01-05 – 2018-01-06 (×2): via INTRAVENOUS

## 2018-01-05 MED ORDER — GLYCOPYRROLATE 0.2 MG/ML IV SOSY
PREFILLED_SYRINGE | INTRAVENOUS | Status: AC
  Filled 2018-01-05: qty 3

## 2018-01-05 MED ORDER — GABAPENTIN 300 MG PO CAPS
300.0000 mg | ORAL_CAPSULE | Freq: Two times a day (BID) | ORAL | Status: DC
  Administered 2018-01-05 – 2018-01-07 (×4): 300 mg via ORAL
  Filled 2018-01-05 (×4): qty 1

## 2018-01-05 MED ORDER — TRANEXAMIC ACID 1000 MG/10ML IV SOLN
1000.0000 mg | Freq: Once | INTRAVENOUS | Status: AC
  Administered 2018-01-05: 1000 mg via INTRAVENOUS
  Filled 2018-01-05: qty 1100

## 2018-01-05 MED ORDER — FENTANYL CITRATE (PF) 100 MCG/2ML IJ SOLN
50.0000 ug | INTRAMUSCULAR | Status: DC
  Administered 2018-01-05: 50 ug via INTRAVENOUS
  Filled 2018-01-05: qty 2

## 2018-01-05 MED ORDER — PROPOFOL 10 MG/ML IV BOLUS
INTRAVENOUS | Status: AC
  Filled 2018-01-05: qty 60

## 2018-01-05 MED ORDER — PHENYLEPHRINE HCL 10 MG/ML IJ SOLN
INTRAVENOUS | Status: DC | PRN
  Administered 2018-01-05: 40 ug/min via INTRAVENOUS

## 2018-01-05 MED ORDER — HYDROCODONE-ACETAMINOPHEN 5-325 MG PO TABS
1.0000 | ORAL_TABLET | ORAL | Status: DC | PRN
  Administered 2018-01-05: 2 via ORAL
  Administered 2018-01-05 – 2018-01-07 (×3): 1 via ORAL
  Filled 2018-01-05 (×3): qty 1
  Filled 2018-01-05: qty 2

## 2018-01-05 MED ORDER — SODIUM CHLORIDE 0.9 % IJ SOLN
INTRAMUSCULAR | Status: DC | PRN
  Administered 2018-01-05: 30 mL

## 2018-01-05 MED ORDER — DOCUSATE SODIUM 100 MG PO CAPS
100.0000 mg | ORAL_CAPSULE | Freq: Two times a day (BID) | ORAL | Status: DC
  Administered 2018-01-05 – 2018-01-07 (×4): 100 mg via ORAL
  Filled 2018-01-05 (×4): qty 1

## 2018-01-05 MED ORDER — BUPIVACAINE LIPOSOME 1.3 % IJ SUSP
20.0000 mL | Freq: Once | INTRAMUSCULAR | Status: DC
  Filled 2018-01-05: qty 20

## 2018-01-05 MED ORDER — DIPHENHYDRAMINE HCL 12.5 MG/5ML PO ELIX: 12.5000 mg | ORAL_SOLUTION | ORAL | Status: DC | PRN

## 2018-01-05 MED ORDER — PROPOFOL 500 MG/50ML IV EMUL
INTRAVENOUS | Status: DC | PRN
  Administered 2018-01-05: 75 ug/kg/min via INTRAVENOUS

## 2018-01-05 MED ORDER — ONDANSETRON HCL 4 MG/2ML IJ SOLN: 4.0000 mg | Freq: Four times a day (QID) | INTRAMUSCULAR | Status: DC | PRN

## 2018-01-05 MED ORDER — ACETAMINOPHEN 500 MG PO TABS
500.0000 mg | ORAL_TABLET | Freq: Four times a day (QID) | ORAL | Status: AC
  Administered 2018-01-06: 500 mg via ORAL
  Filled 2018-01-05: qty 1

## 2018-01-05 MED ORDER — BUPIVACAINE-EPINEPHRINE 0.5% -1:200000 IJ SOLN
INTRAMUSCULAR | Status: DC | PRN
  Administered 2018-01-05: 30 mL

## 2018-01-05 MED ORDER — STERILE WATER FOR IRRIGATION IR SOLN
Status: DC | PRN
  Administered 2018-01-05: 2000 mL

## 2018-01-05 MED ORDER — ACETAMINOPHEN 325 MG PO TABS: 325.0000 mg | ORAL_TABLET | Freq: Four times a day (QID) | ORAL | Status: DC | PRN

## 2018-01-05 MED ORDER — LACTATED RINGERS IV SOLN: INTRAVENOUS | Status: DC

## 2018-01-05 MED ORDER — FENTANYL CITRATE (PF) 100 MCG/2ML IJ SOLN: 25.0000 ug | INTRAMUSCULAR | Status: DC | PRN

## 2018-01-05 MED ORDER — ALPRAZOLAM 0.5 MG PO TABS
0.5000 mg | ORAL_TABLET | Freq: Every day | ORAL | Status: DC
  Administered 2018-01-05 – 2018-01-06 (×2): 0.5 mg via ORAL
  Filled 2018-01-05 (×2): qty 1

## 2018-01-05 MED ORDER — PROPOFOL 10 MG/ML IV BOLUS
INTRAVENOUS | Status: AC
  Filled 2018-01-05: qty 20

## 2018-01-05 MED ORDER — SIMVASTATIN 20 MG PO TABS
20.0000 mg | ORAL_TABLET | Freq: Every evening | ORAL | Status: DC
  Administered 2018-01-05 – 2018-01-06 (×2): 20 mg via ORAL
  Filled 2018-01-05 (×2): qty 1

## 2018-01-05 MED ORDER — MIDAZOLAM HCL 2 MG/2ML IJ SOLN
1.0000 mg | INTRAMUSCULAR | Status: DC
  Administered 2018-01-05: 1 mg via INTRAVENOUS
  Filled 2018-01-05: qty 2

## 2018-01-05 MED ORDER — MAGNESIUM CITRATE PO SOLN: 1.0000 | Freq: Once | ORAL | Status: DC | PRN

## 2018-01-05 MED ORDER — HYDROCODONE-ACETAMINOPHEN 7.5-325 MG PO TABS: 1.0000 | ORAL_TABLET | ORAL | Status: DC | PRN

## 2018-01-05 MED ORDER — PHENYLEPHRINE HCL 10 MG/ML IJ SOLN
INTRAMUSCULAR | Status: AC
  Filled 2018-01-05: qty 1

## 2018-01-05 MED ORDER — ONDANSETRON HCL 4 MG/2ML IJ SOLN
INTRAMUSCULAR | Status: AC
  Filled 2018-01-05: qty 2

## 2018-01-05 MED ORDER — PROPOFOL 10 MG/ML IV BOLUS
INTRAVENOUS | Status: DC | PRN
  Administered 2018-01-05: 30 mg via INTRAVENOUS

## 2018-01-05 MED ORDER — DEXAMETHASONE SODIUM PHOSPHATE 10 MG/ML IJ SOLN
10.0000 mg | Freq: Two times a day (BID) | INTRAMUSCULAR | Status: AC
  Administered 2018-01-05 – 2018-01-06 (×3): 10 mg via INTRAVENOUS
  Filled 2018-01-05 (×3): qty 1

## 2018-01-05 MED ORDER — LISINOPRIL 20 MG PO TABS
40.0000 mg | ORAL_TABLET | Freq: Every day | ORAL | Status: DC
  Administered 2018-01-05 – 2018-01-07 (×3): 40 mg via ORAL
  Filled 2018-01-05 (×2): qty 2

## 2018-01-05 MED ORDER — ONDANSETRON HCL 4 MG PO TABS
4.0000 mg | ORAL_TABLET | Freq: Four times a day (QID) | ORAL | Status: DC | PRN
  Administered 2018-01-07: 4 mg via ORAL
  Filled 2018-01-05: qty 1

## 2018-01-05 MED ORDER — ASPIRIN EC 325 MG PO TBEC: 325.0000 mg | DELAYED_RELEASE_TABLET | Freq: Two times a day (BID) | ORAL | 0 refills | Status: DC

## 2018-01-05 MED ORDER — CEFAZOLIN SODIUM-DEXTROSE 2-4 GM/100ML-% IV SOLN
2.0000 g | INTRAVENOUS | Status: AC
  Administered 2018-01-05: 2 g via INTRAVENOUS
  Filled 2018-01-05: qty 100

## 2018-01-05 MED ORDER — ONDANSETRON HCL 4 MG/2ML IJ SOLN
INTRAMUSCULAR | Status: DC | PRN
  Administered 2018-01-05: 4 mg via INTRAVENOUS

## 2018-01-05 MED ORDER — CHLORHEXIDINE GLUCONATE 4 % EX LIQD: 60.0000 mL | Freq: Once | CUTANEOUS | Status: DC

## 2018-01-05 MED ORDER — POLYETHYLENE GLYCOL 3350 17 G PO PACK: 17.0000 g | PACK | Freq: Every day | ORAL | Status: DC | PRN

## 2018-01-05 MED ORDER — LACTATED RINGERS IV SOLN
INTRAVENOUS | Status: DC
  Administered 2018-01-05 (×2): via INTRAVENOUS

## 2018-01-05 MED ORDER — SODIUM CHLORIDE 0.9 % IJ SOLN
INTRAMUSCULAR | Status: AC
  Filled 2018-01-05: qty 50

## 2018-01-05 SURGICAL SUPPLY — 53 items
APL SKNCLS STERI-STRIP NONHPOA (GAUZE/BANDAGES/DRESSINGS) ×1
BAG SPEC THK2 15X12 ZIP CLS (MISCELLANEOUS) ×1
BAG ZIPLOCK 12X15 (MISCELLANEOUS) ×3 IMPLANT
BANDAGE ACE 6X5 VEL STRL LF (GAUZE/BANDAGES/DRESSINGS) ×3 IMPLANT
BENZOIN TINCTURE PRP APPL 2/3 (GAUZE/BANDAGES/DRESSINGS) ×3 IMPLANT
BLADE SAW SAG 73X25 THK (BLADE) ×2
BLADE SAW SGTL 18X1.27X75 (BLADE) ×2 IMPLANT
BLADE SAW SGTL 18X1.27X75MM (BLADE) ×1
BLADE SAW SGTL 73X25 THK (BLADE) ×1 IMPLANT
BOOTIES KNEE HIGH SLOAN (MISCELLANEOUS) ×3 IMPLANT
BOWL SMART MIX CTS (DISPOSABLE) ×3 IMPLANT
CAPT KNEE TOTAL 3 ATTUNE ×3 IMPLANT
CEMENT HV SMART SET (Cement) ×6 IMPLANT
CLOSURE WOUND 1/2 X4 (GAUZE/BANDAGES/DRESSINGS) ×1
CUFF TOURN SGL QUICK 34 (TOURNIQUET CUFF) ×3
CUFF TRNQT CYL 34X4X40X1 (TOURNIQUET CUFF) ×1 IMPLANT
DRAPE TOP 10253 STERILE (DRAPES) IMPLANT
DRAPE U-SHAPE 47X51 STRL (DRAPES) ×3 IMPLANT
DRSG AQUACEL AG ADV 3.5X10 (GAUZE/BANDAGES/DRESSINGS) ×3 IMPLANT
DURAPREP 26ML APPLICATOR (WOUND CARE) ×3 IMPLANT
ELECT REM PT RETURN 15FT ADLT (MISCELLANEOUS) ×3 IMPLANT
GLOVE BIOGEL PI IND STRL 7.0 (GLOVE) IMPLANT
GLOVE BIOGEL PI IND STRL 8 (GLOVE) ×2 IMPLANT
GLOVE BIOGEL PI INDICATOR 7.0 (GLOVE) ×12
GLOVE BIOGEL PI INDICATOR 8 (GLOVE) ×4
GLOVE ECLIPSE 7.5 STRL STRAW (GLOVE) ×6 IMPLANT
GOWN SPEC L3 XXLG W/TWL (GOWN DISPOSABLE) ×4 IMPLANT
GOWN STRL REUS W/TWL XL LVL3 (GOWN DISPOSABLE) ×6 IMPLANT
HANDPIECE INTERPULSE COAX TIP (DISPOSABLE) ×3
HOOD PEEL AWAY FLYTE STAYCOOL (MISCELLANEOUS) ×9 IMPLANT
IMMOBILIZER KNEE 20 (SOFTGOODS) ×3
IMMOBILIZER KNEE 20 THIGH 36 (SOFTGOODS) ×1 IMPLANT
MANIFOLD NEPTUNE II (INSTRUMENTS) ×3 IMPLANT
NEEDLE HYPO 22GX1.5 SAFETY (NEEDLE) ×3 IMPLANT
NS IRRIG 1000ML POUR BTL (IV SOLUTION) ×3 IMPLANT
PACK ICE MAXI GEL EZY WRAP (MISCELLANEOUS) ×3 IMPLANT
PACK TOTAL KNEE CUSTOM (KITS) ×3 IMPLANT
PADDING CAST COTTON 6X4 STRL (CAST SUPPLIES) ×3 IMPLANT
POSITIONER SURGICAL ARM (MISCELLANEOUS) ×3 IMPLANT
SET HNDPC FAN SPRY TIP SCT (DISPOSABLE) ×1 IMPLANT
STAPLER VISISTAT 35W (STAPLE) IMPLANT
STRIP CLOSURE SKIN 1/2X4 (GAUZE/BANDAGES/DRESSINGS) ×2 IMPLANT
SUT MNCRL AB 3-0 PS2 18 (SUTURE) ×3 IMPLANT
SUT VIC AB 0 CT1 36 (SUTURE) ×3 IMPLANT
SUT VIC AB 1 CT1 36 (SUTURE) ×6 IMPLANT
SUT VIC AB 2-0 CT1 27 (SUTURE) ×6
SUT VIC AB 2-0 CT1 TAPERPNT 27 (SUTURE) ×2 IMPLANT
SYR CONTROL 10ML LL (SYRINGE) ×6 IMPLANT
TOWEL OR NON WOVEN STRL DISP B (DISPOSABLE) ×3 IMPLANT
TRAY FOLEY CATH 14FR (SET/KITS/TRAYS/PACK) ×2 IMPLANT
WATER STERILE IRR 1000ML POUR (IV SOLUTION) ×6 IMPLANT
WRAP KNEE MAXI GEL POST OP (GAUZE/BANDAGES/DRESSINGS) ×2 IMPLANT
YANKAUER SUCT BULB TIP 10FT TU (MISCELLANEOUS) ×3 IMPLANT

## 2018-01-05 NOTE — Anesthesia Procedure Notes (Signed)
Spinal  Patient location during procedure: OR Start time: 01/05/2018 7:34 AM End time: 01/05/2018 7:38 AM Reason for block: at surgeon's request Staffing Resident/CRNA: Anne Fu, CRNA Performed: resident/CRNA  Preanesthetic Checklist Completed: patient identified, site marked, surgical consent, pre-op evaluation, timeout performed, IV checked, risks and benefits discussed and monitors and equipment checked Spinal Block Patient position: sitting Prep: DuraPrep Patient monitoring: heart rate, continuous pulse ox and blood pressure Approach: midline Location: L2-3 Injection technique: single-shot Needle Needle type: Pencan  Needle gauge: 24 G Needle length: 9 cm Assessment Sensory level: T6 Additional Notes Expiration date of kit checked and confirmed. Patient tolerated procedure well, without complications. X 1 attempt with noted clear CSF return. Loss of motor and sensory on exam post injection.

## 2018-01-05 NOTE — Anesthesia Postprocedure Evaluation (Signed)
Anesthesia Post Note  Patient: Tiana R Abraha  Procedure(s) Performed: LEFT TOTAL KNEE ARTHROPLASTY (Left Knee)     Patient location during evaluation: PACU Anesthesia Type: MAC and Spinal Level of consciousness: oriented and awake and alert Pain management: pain level controlled Vital Signs Assessment: post-procedure vital signs reviewed and stable Respiratory status: spontaneous breathing, respiratory function stable and patient connected to nasal cannula oxygen Cardiovascular status: blood pressure returned to baseline and stable Postop Assessment: no headache, no backache and no apparent nausea or vomiting Anesthetic complications: no    Last Vitals:  Vitals:   01/05/18 0700 01/05/18 0715  BP: 125/85 119/76  Pulse: 74 78  Resp: (!) 22 (!) 22  Temp:    SpO2: 100% 100%    Last Pain:  Vitals:   01/05/18 0559  TempSrc: Oral  PainSc: 0-No pain                 Fauna Neuner

## 2018-01-05 NOTE — Discharge Instructions (Signed)

## 2018-01-05 NOTE — Transfer of Care (Signed)
Immediate Anesthesia Transfer of Care Note  Patient: Stephanie Beasley  Procedure(s) Performed: Procedure(s): LEFT TOTAL KNEE ARTHROPLASTY (Left)  Patient Location: PACU  Anesthesia Type:Spinal  Level of Consciousness:  sedated, patient cooperative and responds to stimulation  Airway & Oxygen Therapy:Patient Spontanous Breathing and Patient connected to face mask oxgen  Post-op Assessment:  Report given to PACU RN and Post -op Vital signs reviewed and stable  Post vital signs:  Reviewed and stable  Last Vitals:  Vitals:   01/05/18 0700 01/05/18 0715  BP: 125/85 119/76  Pulse: 74 78  Resp: (!) 22 (!) 22  Temp:    SpO2: 147% 092%    Complications: No apparent anesthesia complications

## 2018-01-05 NOTE — Brief Op Note (Signed)
01/05/2018  9:11 AM  PATIENT:  Stephanie Beasley  77 y.o. female  PRE-OPERATIVE DIAGNOSIS:  LEFT KNEE OSTEOARTHRITIS  POST-OPERATIVE DIAGNOSIS:  LEFT KNEE OSTEOARTHRITIS  PROCEDURE:  Procedure(s): LEFT TOTAL KNEE ARTHROPLASTY (Left)  SURGEON:  Surgeon(s) and Role:    Dorna Leitz, MD - Primary  PHYSICIAN ASSISTANT:   ASSISTANTS: bethune   ANESTHESIA:   spinal  EBL: minimal  BLOOD ADMINISTERED:none  DRAINS: none   LOCAL MEDICATIONS USED:  MARCAINE    and OTHER experel  SPECIMEN:  No Specimen  DISPOSITION OF SPECIMEN:  N/A  COUNTS:  YES  TOURNIQUET:   Total Tourniquet Time Documented: Thigh (Left) - 50 minutes Total: Thigh (Left) - 50 minutes   DICTATION: .Other Dictation: Dictation Number [id number]  PLAN OF CARE: Admit to inpatient   PATIENT DISPOSITION:  PACU - hemodynamically stable.   Delay start of Pharmacological VTE agent (>24hrs) due to surgical blood loss or risk of bleeding: no

## 2018-01-05 NOTE — Op Note (Signed)
NAMEMARA, Stephanie Beasley MEDICAL RECORD [id number] ACCOUNT [id number] DATE OF BIRTH:Apr 07, 1941 FACILITY: [facility] LOCATION: [facility] PHYSICIAN:Hudsen Fei L. 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    PROCEDURE:  Left total knee replacement with an Attune system, size 6 tibia, size 5 femur, 5 mm bridging bearing, and a 38 mm all polyethylene patella.  SURGEON:  Dorna Leitz, MD  ASSISTANT: Gaspar Skeeters PAc  ANESTHESIA:  Spinal.  BRIEF HISTORY:  The patient is a 77 year old female with a long history significant complaints of left knee pain.  She had been treated conservatively for a long period of time with activity modification, injection therapy and physical therapy.  She had  failed all of these preoperative techniques and was having night pain and light activity pain.  After failure of all conservative care, she was taken to the operating room for left total knee replacement.  Her x-rays show bone on bone change.  DESCRIPTION OF PROCEDURE:  The patient was taken to the operating room and after adequate anesthesia was obtained with a spinal anesthetic, the patient was placed supine on the operating table.  The left leg was prepped and draped in the usual sterile  fashion and after Esmarch exsanguination of the extremity, a blood pressure tourniquet was inflated to 300 mmHg.  Following this, a midline incision is made in the subcutaneous tissue and dissected down to the level of the extensor mechanism and a medial  parapatellar arthrotomy is undertaken.  Once this is completed, attention is turned to the left knee where a midline incision is made followed by a medial parapatellar arthrotomy.  At this point, the medial and lateral meniscus are removed, the  retropatellar fat pad,  synovium on the anterior aspect of the femur, the anterior and posterior cruciates.  Following this, an intramedullary pilot hole is drilled in the femur and 9 mm of distal bone is resected with a 4-degree valgus inclination.   Following this, the femur sized to a #5.  Anterior and posterior cuts are made with the chamfers and box.  Attention is turned to the tibia.  It is cut perpendicular to its long axis with a 3-degree posterior slope and it is drilled and keeled with a  size 6 tibia.  Following this, a 6-mm bridging bearing is placed.  This is a little tight.  We go back to the 5.  Full extension is achieved, full flexion and no issues with motion, mobility and stability.  At this point, attention is turned to the  patella.  It is cut down to a level of 14 mm.  A 38 paddle is chosen.  The lugs are drilled and the patella is placed and the knee put through a range of motion, excellent stability and range of motion are achieved at this point.  Following this, the  trial components are removed and the knee is copiously and thoroughly lavaged with pulsatile lavage irrigation and suctioned dry.  The final components are then cemented into place, a size 6 tibia, size 5 femur, 5 mm bridging bearing trial is placed and  held with the leg in extension.  All excess bone cement is removed and cement is allowed to completely harden.  The patella is placed and held with a clamp, a 38, and it is held with a clamp until all cement  is completely hardened.  All excess bone  cement is removed and when the cement is completely hardened, the tourniquet is let down, all bleeding is controlled with electrocautery.  The knee is irrigated and suctioned dry.  The trial poly is removed and placed with the final size 5.  Knee put  through a range of motion and excellent range of motion and stability are achieved.  The knee is then closed with #1 Vicryl running suture from above and below.  Once this is done, the skin is closed  with 0 and 2-0 Vicryl and 3-0 Monocryl subcuticular.   Benzoin and Steri-Strips are applied.  Sterile compressive dressing is applied and the patient is taken to the recovery room where she is noted to be in satisfactory condition with the estimated blood loss for procedure being minimal.  TN/NUANCE  D:01/05/2018 T:01/05/2018 JOB:[id number]

## 2018-01-05 NOTE — Anesthesia Procedure Notes (Signed)
Anesthesia Regional Block: Adductor canal block   Pre-Anesthetic Checklist: ,, timeout performed, Correct Patient, Correct Site, Correct Laterality, Correct Procedure, Correct Position, site marked, Risks and benefits discussed,  Surgical consent,  Pre-op evaluation,  At surgeon's request and post-op pain management  Laterality: Left  Prep: chloraprep       Needles:  Injection technique: Single-shot  Needle Type: Echogenic Stimulator Needle     Needle Length: 5cm  Needle Gauge: 22     Additional Needles:   Procedures:, nerve stimulator,,, ultrasound used (permanent image in chart),,,,  Narrative:  Start time: 01/05/2018 6:57 AM End time: 01/05/2018 7:03 AM Injection made incrementally with aspirations every 5 mL.  Performed by: Personally  Anesthesiologist: Janeece Riggers, MD  Additional Notes: Functioning IV was confirmed and monitors were applied.  A 19mm 22ga Arrow echogenic stimulator needle was used. Sterile prep and drape,hand hygiene and sterile gloves were used. Ultrasound guidance: relevant anatomy identified, needle position confirmed, local anesthetic spread visualized around nerve(s)., vascular puncture avoided.  Image printed for medical record. Negative aspiration and negative test dose prior to incremental administration of local anesthetic. The patient tolerated the procedure well.

## 2018-01-05 NOTE — Evaluation (Signed)
Physical Therapy Evaluation Patient Details Name: Stephanie Beasley MRN: [id number] DOB: Sep 04, 1941 Today's Date: 01/05/2018   History of Present Illness  Pt s/p L TKR   Clinical Impression  Pt s/p L TKR and presents with decreased L LE strength/ROM and post op pain limiting functional mobility.  Pt should progress well to dc home with family assist.    Follow Up Recommendations Home health PT;Follow surgeon's recommendation for DC plan and follow-up therapies    Equipment Recommendations  Rolling walker with 5" wheels    Recommendations for Other Services       Precautions / Restrictions Precautions Precautions: Fall;Knee Required Braces or Orthoses: Knee Immobilizer - Left Knee Immobilizer - Left: Discontinue once straight leg raise with < 10 degree lag Restrictions Weight Bearing Restrictions: No Other Position/Activity Restrictions: WBAT      Mobility  Bed Mobility Overal bed mobility: Needs Assistance Bed Mobility: Supine to Sit     Supine to sit: Min assist     General bed mobility comments: cues for sequence and use of R LE to self assist  Transfers Overall transfer level: Needs assistance Equipment used: Rolling walker (2 wheeled) Transfers: Sit to/from Stand Sit to Stand: Min assist         General transfer comment: cues for LE management and use of UEs to self assist  Ambulation/Gait Ambulation/Gait assistance: Min assist;+2 safety/equipment Ambulation Distance (Feet): 30 Feet Assistive device: Rolling walker (2 wheeled) Gait Pattern/deviations: Step-to pattern;Decreased step length - right;Decreased step length - left;Shuffle;Trunk flexed Gait velocity: decr   General Gait Details: cues for sequence, posture and position from [employer]            Wheelchair Mobility    Modified Rankin (Stroke Patients Only)       Balance                                             Pertinent Vitals/Pain Pain Assessment: 0-10 Pain  Score: 3  Pain Location: L knee Pain Descriptors / Indicators: Aching;Sore Pain Intervention(s): Limited activity within patient's tolerance;Monitored during session;Premedicated before session;Ice applied    Home Living Family/patient expects to be discharged to:: Private residence Living Arrangements: Spouse/significant other;Other relatives Available Help at Discharge: Family Type of Home: House Home Access: Stairs to enter Entrance Stairs-Rails: Right Entrance Stairs-Number of Steps: 13 Home Layout: Two level Home Equipment: [place] - 2 wheels;Bedside commode;Crutches      Prior Function Level of Independence: Independent               Hand Dominance        Extremity/Trunk Assessment   Upper Extremity Assessment Upper Extremity Assessment: Overall WFL for tasks assessed    Lower Extremity Assessment Lower Extremity Assessment: LLE deficits/detail    Cervical / Trunk Assessment Cervical / Trunk Assessment: Normal  Communication   Communication: No difficulties  Cognition Arousal/Alertness: Awake/alert Behavior During Therapy: WFL for tasks assessed/performed Overall Cognitive Status: Within Functional Limits for tasks assessed                                        General Comments      Exercises     Assessment/Plan    PT Assessment Patient needs continued PT services  PT Problem List Decreased strength;Decreased  range of motion;Decreased activity tolerance;Decreased mobility;Decreased knowledge of use of DME;Pain       PT Treatment Interventions DME instruction;Gait training;Stair training;Functional mobility training;Therapeutic activities;Therapeutic exercise;Patient/family education    PT Goals (Current goals can be found in the Care Plan section)  Acute Rehab PT Goals Patient Stated Goal: Regain IND PT Goal Formulation: With patient Time For Goal Achievement: 01/12/18 Potential to Achieve Goals: Good    Frequency  7X/week   Barriers to discharge        Co-evaluation               AM-PAC PT "6 Clicks" Daily Activity  Outcome Measure Difficulty turning over in bed (including adjusting bedclothes, sheets and blankets)?: Unable Difficulty moving from lying on back to sitting on the side of the bed? : Unable Difficulty sitting down on and standing up from a chair with arms (e.g., wheelchair, bedside commode, etc,.)?: Unable Help needed moving to and from a bed to chair (including a wheelchair)?: A Little Help needed walking in hospital room?: A Little Help needed climbing 3-5 steps with a railing? : A Little 6 Click Score: 12    End of Session Equipment Utilized During Treatment: Gait belt;Left knee immobilizer Activity Tolerance: Patient tolerated treatment well Patient left: in chair;with call bell/phone within reach;with family/visitor present Nurse Communication: Mobility status PT Visit Diagnosis: Difficulty in walking, not elsewhere classified (R26.2)    Time: 7867-5449 PT Time Calculation (min) (ACUTE ONLY): 23 min   Charges:   PT Evaluation $PT Eval Low Complexity: 1 Low PT Treatments $Gait Training: 8-22 mins   PT G Codes:        Pg [phone]   Marlean Mortell 01/05/2018, 6:34 PM

## 2018-01-05 NOTE — H&P (Signed)
TOTAL KNEE ADMISSION H&P  Patient is being admitted for left total knee arthroplasty.  Subjective:  Chief Complaint:left knee pain.  HPI: Stephanie Beasley, 77 y.o. female, has a history of pain and functional disability in the left knee due to arthritis and has failed non-surgical conservative treatments for greater than 12 weeks to includeNSAID's and/or analgesics, corticosteriod injections, viscosupplementation injections, use of assistive devices and activity modification.  Onset of symptoms was gradual, starting 4 years ago with gradually worsening course since that time. The patient noted no past surgery on the left knee(s).  Patient currently rates pain in the left knee(s) at 9 out of 10 with activity. Patient has night pain, worsening of pain with activity and weight bearing, pain that interferes with activities of daily living, pain with passive range of motion, crepitus and joint swelling.  Patient has evidence of subchondral sclerosis, periarticular osteophytes and joint space narrowing by imaging studies. This patient has had failure of all reasonable conservative care. There is no active infection.  There are no active problems to display for this patient.  Past Medical History:  Diagnosis Date  . Anal fissure   . Cataract    surgery to remove in 2016 -bilateral  . Depression with anxiety   . Diverticulitis of colon    ? 2011  . Esophageal stricture   . GERD (gastroesophageal reflux disease)   . Hyperlipidemia   . Hypertension   . Insomnia   . Osteoarthritis    knees  . Osteopenia   . Posterior vitreous detachment   . Tubular adenoma polyp of rectum   . Vitamin B12 deficiency   . Vitamin D insufficiency     Past Surgical History:  Procedure Laterality Date  . APPENDECTOMY    . BREAST CYST ASPIRATION    . COLONOSCOPY  02/2011    polyps/tics/hems  . eye surgery Bilateral 01/2015   remove cataracts  . ROTATOR CUFF REPAIR Bilateral    x 2  . TOTAL ABDOMINAL  HYSTERECTOMY W/ BILATERAL SALPINGOOPHORECTOMY    . WISDOM TOOTH EXTRACTION      Current Facility-Administered Medications  Medication Dose Route Frequency Provider Last Rate Last Dose  . bupivacaine liposome (EXPAREL) 1.3 % injection 266 mg  20 mL Infiltration Once Dorna Leitz, MD      . ceFAZolin (ANCEF) IVPB 2g/100 mL premix  2 g Intravenous On Call to OR Dorna Leitz, MD      . chlorhexidine (HIBICLENS) 4 % liquid 4 application  60 mL Topical Once Dorna Leitz, MD      . fentaNYL (SUBLIMAZE) injection 50-100 mcg  50-100 mcg Intravenous Liliane Bade, MD      . lactated ringers infusion   Intravenous Continuous Dorna Leitz, MD      . lactated ringers infusion   Intravenous Continuous Janeece Riggers, MD      . midazolam (VERSED) injection 1-2 mg  1-2 mg Intravenous Liliane Bade, MD      . tranexamic acid (CYKLOKAPRON) 1,000 mg in sodium chloride 0.9 % 100 mL IVPB  1,000 mg Intravenous On Call to [place], Drew A, RPH       No Known Allergies  Social History   Tobacco Use  . Smoking status: Never Smoker  . Smokeless tobacco: Never Used  Substance Use Topics  . Alcohol use: No    Family History  Problem Relation Age of Onset  . COPD Mother   . Gout Unknown   . Diabetes type II Unknown   .  Stroke Unknown   . Breast cancer Unknown        grandmother  . Colon cancer Neg Hx   . Colon polyps Neg Hx   . Rectal cancer Neg Hx   . Stomach cancer Neg Hx      ROS ROS: I have reviewed the patient's review of systems thoroughly and there are no positive responses as relates to the HPI. Objective:  Physical Exam  Vital signs in last 24 hours: Temp:  [98.6 F (37 C)] 98.6 F (37 C) (05/03 0559) Pulse Rate:  [80] 80 (05/03 0559) Resp:  [16] 16 (05/03 0559) BP: (124)/(77) 124/77 (05/03 0559) SpO2:  [97 %] 97 % (05/03 0559) Weight:  [79.8 kg (176 lb)] 79.8 kg (176 lb) (05/03 0559) Well-developed well-nourished patient in no acute distress. Alert and oriented  x3 HEENT:within normal limits Cardiac: Regular rate and rhythm Pulmonary: Lungs clear to auscultation Abdomen: Soft and nontender.  Normal active bowel sounds  Musculoskeletal: l knee: painful rom limited rom no instability. Trace effusion NVI distally Labs: Recent Results (from the past 2160 hour(s))  Type and screen Order type and screen if day of surgery is less than 15 days from draw of preadmission visit or order morning of surgery if day of surgery is greater than 6 days from preadmission visit.     Status: None   Collection Time: 12/28/17 10:28 AM  Result Value Ref Range   ABO/RH(D) O POS    Antibody Screen NEG    Sample Expiration 01/08/2018    Extend sample reason      NO TRANSFUSIONS OR PREGNANCY IN THE PAST 3 MONTHS Performed at [facility], [place] [address], [place], [place] [address]   ABO/Rh     Status: None   Collection Time: 12/28/17 10:28 AM  Result Value Ref Range   ABO/RH(D)      O POS Performed at [facility], [place] [address], [place], [place] [address]   APTT     Status: None   Collection Time: 12/28/17 10:37 AM  Result Value Ref Range   aPTT 27 24 - 36 seconds    Comment: Performed at [facility], [place] [address], [place], [place] [address]  Basic metabolic panel     Status: Abnormal   Collection Time: 12/28/17 10:37 AM  Result Value Ref Range   Sodium 139 135 - 145 mmol/L   Potassium 4.4 3.5 - 5.1 mmol/L   Chloride 106 101 - 111 mmol/L   CO2 22 22 - 32 mmol/L   Glucose, Bld 105 (H) 65 - 99 mg/dL   BUN 21 (H) 6 - 20 mg/dL   Creatinine, Ser 0.89 0.44 - 1.00 mg/dL   Calcium 9.5 8.9 - 10.3 mg/dL   GFR calc non Af Amer >60 >60 mL/min   GFR calc Af Amer >60 >60 mL/min    Comment: (NOTE) The eGFR has been calculated using the CKD EPI equation. This calculation has not been validated in all clinical situations. eGFR's persistently <60 mL/min signify possible Chronic Kidney Disease.    Anion gap 11  5 - 15    Comment: Performed at [facility], [place] [address] [place], [place] [address]  CBC WITH DIFFERENTIAL     Status: None   Collection Time: 12/28/17 10:37 AM  Result Value Ref Range   WBC 4.0 4.0 - 10.5 K/uL   RBC 4.72 3.87 - 5.11 MIL/uL   Hemoglobin 14.2 12.0 - 15.0 g/dL  HCT 41.8 36.0 - 46.0 %   MCV 88.6 78.0 - 100.0 fL   MCH 30.1 26.0 - 34.0 pg   MCHC 34.0 30.0 - 36.0 g/dL   RDW 12.6 11.5 - 15.5 %   Platelets 226 150 - 400 K/uL   Neutrophils Relative % 51 %   Neutro Abs 2.1 1.7 - 7.7 K/uL   Lymphocytes Relative 35 %   Lymphs Abs 1.4 0.7 - 4.0 K/uL   Monocytes Relative 11 %   Monocytes Absolute 0.5 0.1 - 1.0 K/uL   Eosinophils Relative 3 %   Eosinophils Absolute 0.1 0.0 - 0.7 K/uL   Basophils Relative 0 %   Basophils Absolute 0.0 0.0 - 0.1 K/uL    Comment: Performed at [facility], [place] [address], [place], [place] [address]  Urinalysis, Routine w reflex microscopic     Status: Abnormal   Collection Time: 12/28/17 10:37 AM  Result Value Ref Range   Color, Urine COLORLESS (A) YELLOW   APPearance CLEAR CLEAR   Specific Gravity, Urine 1.003 (L) 1.005 - 1.030   pH 7.0 5.0 - 8.0   Glucose, UA NEGATIVE NEGATIVE mg/dL   Hgb urine dipstick NEGATIVE NEGATIVE   Bilirubin Urine NEGATIVE NEGATIVE   Ketones, ur NEGATIVE NEGATIVE mg/dL   Protein, ur NEGATIVE NEGATIVE mg/dL   Nitrite NEGATIVE NEGATIVE   Leukocytes, UA NEGATIVE NEGATIVE    Comment: Performed at [place] [address] [place], [place] [address]  Surgical pcr screen     Status: None   Collection Time: 12/28/17 10:37 AM  Result Value Ref Range   MRSA, PCR NEGATIVE NEGATIVE   Staphylococcus aureus NEGATIVE NEGATIVE    Comment: (NOTE) The Xpert SA Assay (FDA approved for NASAL specimens in patients 72 years of age and older), is one component of a comprehensive surveillance program. It is not intended to diagnose infection nor to guide or monitor  treatment. Performed at [facility], [place] [address] [place], [place] [address]     Estimated body mass index is 27.57 kg/m as calculated from the following:   Height as of this encounter: 5' 7" (1.702 m).   Weight as of this encounter: 79.8 kg (176 lb).   Imaging Review Plain radiographs demonstrate severe degenerative joint disease of the left knee(s). The overall alignment ismild varus. The bone quality appears to be fair for age and reported activity level.   Preoperative templating of the joint replacement has been completed, documented, and submitted to the Operating Room personnel in order to optimize intra-operative equipment management.    Patient's anticipated LOS is less than 2 midnights, meeting these requirements: - Younger than 30 - Lives within 1 hour of care - Has a competent adult at home to recover with post-op recover - NO history of  - Chronic pain requiring opiods  - Diabetes  - Coronary Artery Disease  - Heart failure  - Heart attack  - Stroke  - DVT/VTE  - Cardiac arrhythmia  - Respiratory Failure/COPD  - Renal failure  - Anemia  - Advanced Liver disease        Assessment/Plan:  End stage arthritis, left knee   The patient history, physical examination, clinical judgment of the provider and imaging studies are consistent with end stage degenerative joint disease of the left knee(s) and total knee arthroplasty is deemed medically necessary. The treatment options including medical management, injection therapy arthroscopy and arthroplasty were discussed at length. The risks and benefits of  total knee arthroplasty were presented and reviewed. The risks due to aseptic loosening, infection, stiffness, patella tracking problems, thromboembolic complications and other imponderables were discussed. The patient acknowledged the explanation, agreed to proceed with the plan and consent was signed. Patient is being admitted for inpatient  treatment for surgery, pain control, PT, OT, prophylactic antibiotics, VTE prophylaxis, progressive ambulation and ADL's and discharge planning. The patient is planning to be discharged home with home health services

## 2018-01-06 LAB — CBC
HCT: 37.7 % (ref 36.0–46.0)
Hemoglobin: 12.6 g/dL (ref 12.0–15.0)
MCH: 29.9 pg (ref 26.0–34.0)
MCHC: 33.4 g/dL (ref 30.0–36.0)
MCV: 89.5 fL (ref 78.0–100.0)
PLATELETS: 252 10*3/uL (ref 150–400)
RBC: 4.21 MIL/uL (ref 3.87–5.11)
RDW: 12.9 % (ref 11.5–15.5)
WBC: 13 10*3/uL — AB (ref 4.0–10.5)

## 2018-01-06 LAB — BASIC METABOLIC PANEL
ANION GAP: 12 (ref 5–15)
BUN: 16 mg/dL (ref 6–20)
CALCIUM: 8.8 mg/dL — AB (ref 8.9–10.3)
CO2: 20 mmol/L — ABNORMAL LOW (ref 22–32)
Chloride: 108 mmol/L (ref 101–111)
Creatinine, Ser: 0.92 mg/dL (ref 0.44–1.00)
GFR calc Af Amer: >60 mL/min (ref >60)
GFR, EST NON AFRICAN AMERICAN: 59 mL/min — AB (ref >60)
Glucose, Bld: 150 mg/dL — ABNORMAL HIGH (ref 65–99)
POTASSIUM: 4.3 mmol/L (ref 3.5–5.1)
SODIUM: 140 mmol/L (ref 135–145)

## 2018-01-06 MED ORDER — FAMOTIDINE 20 MG PO TABS
20.0000 mg | ORAL_TABLET | Freq: Every day | ORAL | Status: DC | PRN
Start: 2018-01-06 — End: 2018-01-07

## 2018-01-06 NOTE — Progress Notes (Signed)
    Patient doing well  Had PT yesterday and this AM Has been ambulating   Physical Exam: Vitals:   01/06/18 0030 01/06/18 0504  BP: 99/68 109/70  Pulse: 71 73  Resp: 18 17  Temp: 98.2 F (36.8 C) 98.1 F (36.7 C)  SpO2: 96% 95%    Dressing in place NVI  Hg 12.6  POD #1 s/p L TKA, doing well  - up with PT, encourage ambulation - likely d/c home tomorrow with f/u in 2 weeks, per PT, as patient has 26 steps to get into home

## 2018-01-06 NOTE — Progress Notes (Signed)
Physical Therapy Treatment Patient Details Name: Stephanie Beasley MRN: [id number] DOB: 06-05-1941 Today's Date: 01/06/2018    History of Present Illness Pt s/p L TKR     PT Comments    Pt continues motivated, progressing well with mobility and hopeful for dc home tomorrow.   Follow Up Recommendations  Home health PT;Follow surgeon's recommendation for DC plan and follow-up therapies     Equipment Recommendations  None recommended by PT    Recommendations for Other Services       Precautions / Restrictions Precautions Precautions: Fall;Knee Required Braces or Orthoses: Knee Immobilizer - Left Knee Immobilizer - Left: Discontinue once straight leg raise with < 10 degree lag Restrictions Weight Bearing Restrictions: No Other Position/Activity Restrictions: WBAT    Mobility  Bed Mobility Overal bed mobility: Needs Assistance Bed Mobility: Supine to Sit;Sit to Supine     Supine to sit: Min guard Sit to supine: Min guard   General bed mobility comments: cues for sequence and use of R LE to self assist  Transfers Overall transfer level: Needs assistance Equipment used: Rolling walker (2 wheeled) Transfers: Sit to/from Stand Sit to Stand: Min guard         General transfer comment: cues for LE management and use of UEs to self assist  Ambulation/Gait Ambulation/Gait assistance: Min assist;Min guard Ambulation Distance (Feet): 150 Feet Assistive device: Rolling walker (2 wheeled) Gait Pattern/deviations: Step-to pattern;Decreased step length - right;Decreased step length - left;Shuffle;Trunk flexed Gait velocity: decr   General Gait Details: cues for sequence, posture and position from [employer]   Stairs Stairs: Yes Stairs assistance: Min assist Stair Management: One rail Right;Step to pattern;Forwards;With crutches Number of Stairs: 5 General stair comments: cues for sequence and foot/crutch placement   Wheelchair Mobility    Modified Rankin (Stroke Patients  Only)       Balance                                            Cognition Arousal/Alertness: Awake/alert Behavior During Therapy: WFL for tasks assessed/performed Overall Cognitive Status: Within Functional Limits for tasks assessed                                        Exercises Total Joint Exercises Ankle Circles/Pumps: AROM;Both;15 reps;Supine Quad Sets: AROM;Both;10 reps;Supine Heel Slides: AAROM;Left;15 reps;Supine Straight Leg Raises: AAROM;Left;10 reps;Supine    General Comments        Pertinent Vitals/Pain Pain Assessment: 0-10 Pain Score: 3  Pain Location: L knee Pain Descriptors / Indicators: Aching;Sore Pain Intervention(s): Limited activity within patient's tolerance;Monitored during session;Ice applied    Home Living                      Prior Function            PT Goals (current goals can now be found in the care plan section) Acute Rehab PT Goals Patient Stated Goal: Regain IND PT Goal Formulation: With patient Time For Goal Achievement: 01/12/18 Potential to Achieve Goals: Good Progress towards PT goals: Progressing toward goals    Frequency    7X/week      PT Plan Current plan remains appropriate    Co-evaluation              AM-PAC  PT "6 Clicks" Daily Activity  Outcome Measure  Difficulty turning over in bed (including adjusting bedclothes, sheets and blankets)?: A Lot Difficulty moving from lying on back to sitting on the side of the bed? : A Lot Difficulty sitting down on and standing up from a chair with arms (e.g., wheelchair, bedside commode, etc,.)?: A Lot Help needed moving to and from a bed to chair (including a wheelchair)?: A Little Help needed walking in hospital room?: A Little Help needed climbing 3-5 steps with a railing? : A Little 6 Click Score: 15    End of Session Equipment Utilized During Treatment: Gait belt;Left knee immobilizer Activity Tolerance: Patient  tolerated treatment well Patient left: in bed;with call bell/phone within reach Nurse Communication: Mobility status PT Visit Diagnosis: Difficulty in walking, not elsewhere classified (R26.2)     Time: 3967-2897 PT Time Calculation (min) (ACUTE ONLY): 26 min  Charges:  $Gait Training: 8-22 mins $Therapeutic Exercise: 8-22 mins $Therapeutic Activity: 8-22 mins                    G Codes:       Pg [phone]    Devynne Sturdivant 01/06/2018, 3:55 PM

## 2018-01-06 NOTE — Progress Notes (Signed)
Foley cath removed at approximately 0620.,Patient tolerated procedure.

## 2018-01-06 NOTE — Progress Notes (Signed)
Physical Therapy Treatment Patient Details Name: Stephanie Beasley MRN: [id number] DOB: [birth year] Today's Date: 01/06/2018    History of Present Illness Pt s/p L TKR     PT Comments    Pt motivated and making steady progress with mobility.  Pt hopeful for dc home tomorrow.   Follow Up Recommendations  Home health PT;Follow surgeon's recommendation for DC plan and follow-up therapies     Equipment Recommendations  Rolling walker with 5" wheels    Recommendations for Other Services       Precautions / Restrictions Precautions Precautions: Fall;Knee Required Braces or Orthoses: Knee Immobilizer - Left Knee Immobilizer - Left: Discontinue once straight leg raise with < 10 degree lag Restrictions Weight Bearing Restrictions: No Other Position/Activity Restrictions: WBAT    Mobility  Bed Mobility Overal bed mobility: Needs Assistance Bed Mobility: Supine to Sit     Supine to sit: Min assist     General bed mobility comments: cues for sequence and use of R LE to self assist  Transfers Overall transfer level: Needs assistance Equipment used: Rolling walker (2 wheeled) Transfers: Sit to/from Stand Sit to Stand: Min guard         General transfer comment: cues for LE management and use of UEs to self assist  Ambulation/Gait Ambulation/Gait assistance: Min assist Ambulation Distance (Feet): 95 Feet Assistive device: Rolling walker (2 wheeled) Gait Pattern/deviations: Step-to pattern;Decreased step length - right;Decreased step length - left;Shuffle;Trunk flexed Gait velocity: decr   General Gait Details: cues for sequence, posture and position from [employer]             Wheelchair Mobility    Modified Rankin (Stroke Patients Only)       Balance                                            Cognition Arousal/Alertness: Awake/alert Behavior During Therapy: WFL for tasks assessed/performed Overall Cognitive Status: Within Functional  Limits for tasks assessed                                        Exercises Total Joint Exercises Ankle Circles/Pumps: AROM;Both;15 reps;Supine Quad Sets: AROM;Both;10 reps;Supine Heel Slides: AAROM;Left;15 reps;Supine Straight Leg Raises: AAROM;Left;10 reps;Supine    General Comments        Pertinent Vitals/Pain Pain Assessment: 0-10 Pain Score: 3  Pain Location: L knee Pain Descriptors / Indicators: Aching;Sore Pain Intervention(s): Limited activity within patient's tolerance;Monitored during session;Ice applied    Home Living                      Prior Function            PT Goals (current goals can now be found in the care plan section) Acute Rehab PT Goals Patient Stated Goal: Regain IND PT Goal Formulation: With patient Time For Goal Achievement: 01/12/18 Potential to Achieve Goals: Good Progress towards PT goals: Progressing toward goals    Frequency    7X/week      PT Plan Current plan remains appropriate    Co-evaluation              AM-PAC PT "6 Clicks" Daily Activity  Outcome Measure  Difficulty turning over in bed (including adjusting bedclothes, sheets and blankets)?:  Unable Difficulty moving from lying on back to sitting on the side of the bed? : Unable Difficulty sitting down on and standing up from a chair with arms (e.g., wheelchair, bedside commode, etc,.)?: Unable Help needed moving to and from a bed to chair (including a wheelchair)?: A Little Help needed walking in hospital room?: A Little Help needed climbing 3-5 steps with a railing? : A Little 6 Click Score: 12    End of Session Equipment Utilized During Treatment: Gait belt;Left knee immobilizer Activity Tolerance: Patient tolerated treatment well Patient left: in chair;with call bell/phone within reach;with family/visitor present Nurse Communication: Mobility status PT Visit Diagnosis: Difficulty in walking, not elsewhere classified (R26.2)      Time: 7001-7494 PT Time Calculation (min) (ACUTE ONLY): 38 min  Charges:  $Gait Training: 23-37 mins $Therapeutic Exercise: 8-22 mins                    G Codes:       Pg [phone]    Raymundo Rout 01/06/2018, 1:48 PM

## 2018-01-07 LAB — CBC
HEMATOCRIT: 35.8 % — AB (ref 36.0–46.0)
HEMOGLOBIN: 11.6 g/dL — AB (ref 12.0–15.0)
MCH: 29 pg (ref 26.0–34.0)
MCHC: 32.4 g/dL (ref 30.0–36.0)
MCV: 89.5 fL (ref 78.0–100.0)
Platelets: 215 10*3/uL (ref 150–400)
RBC: 4 MIL/uL (ref 3.87–5.11)
RDW: 13.2 % (ref 11.5–15.5)
WBC: 10.6 10*3/uL — AB (ref 4.0–10.5)

## 2018-01-07 NOTE — Progress Notes (Signed)
Discharged from floor via w/c for transport home by car. Belongings & spouse with pt. No changes in assessment. Stephanie Beasley  

## 2018-01-07 NOTE — Care Management Note (Signed)
Discharge Planning:  NCM spoke to pt and offered choice for [facility]. Pt agreeable to [facility]. (preoperatively arranged by surgeon's office). Pt states she has RW and 3n1 bedside commode at home. Pt had questions about CPM for home. Left message for attending. Will need HHPT orders with [facility]. Jonnie Finner RN CCM Case Mgmt phone [phone]

## 2018-01-07 NOTE — Progress Notes (Signed)
    Patient doing well PO day 2 S/P TKA by Dr Berenice Primas. Minimal well controlled pain. Good progress with PT/OT. She has completed adequate stair training. She is ambulating and eating NL, NL B/B function.   Physical Exam: Vitals:   01/06/18 2117 01/07/18 0613  BP: 129/76 131/88  Pulse: 77 86  Resp: 16 16  Temp: 97.8 F (36.6 C) 98.5 F (36.9 C)  SpO2: 97% 96%   CBC Latest Ref Rng & Units 01/07/2018 01/06/2018 12/28/2017  WBC 4.0 - 10.5 K/uL 10.6(H) 13.0(H) 4.0  Hemoglobin 12.0 - 15.0 g/dL 11.6(L) 12.6 14.2  Hematocrit 36.0 - 46.0 % 35.8(L) 37.7 41.8  Platelets 150 - 400 K/uL 215 252 226    Dressing in place, Pt sitting up comfortably in bed NVI  POD #2 s/p L TKA, doing well  - up with PT, encourage ambulation - D/C home today  - ASA 325 BID DVT prophylaxis - WBAT  - F/U in office 2 weeks

## 2018-01-07 NOTE — Progress Notes (Signed)
Physical Therapy Treatment Patient Details Name: Stephanie Beasley MRN: [id number] DOB: March 01, 1941 Today's Date: 01/07/2018    History of Present Illness Pt s/p L TKR     PT Comments    Pt making excellent progress; completed stair training this am with husband/PT providing min/guard to supervision; pt has 2 flights of stairs at home, feels more confident today and ready for d/c;    Follow Up Recommendations  Home health PT;Follow surgeon's recommendation for DC plan and follow-up therapies     Equipment Recommendations  None recommended by PT    Recommendations for Other Services       Precautions / Restrictions Precautions Precautions: Fall;Knee Required Braces or Orthoses: Knee Immobilizer - Left Knee Immobilizer - Left: Discontinue once straight leg raise with < 10 degree lag Restrictions Weight Bearing Restrictions: No Other Position/Activity Restrictions: WBAT    Mobility  Bed Mobility   Bed Mobility: Supine to Sit     Supine to sit: Supervision     General bed mobility comments: incr time, pt uses RLE to self assist  Transfers Overall transfer level: Needs assistance Equipment used: Rolling walker (2 wheeled) Transfers: Sit to/from Stand Sit to Stand: Supervision;Modified independent (Device/Increase time)         General transfer comment: pt self corrects for hand placement  Ambulation/Gait Ambulation/Gait assistance: Supervision Ambulation Distance (Feet): 40 Feet Assistive device: Rolling walker (2 wheeled) Gait Pattern/deviations: Step-to pattern;Decreased step length - right;Decreased step length - left Gait velocity: decr   General Gait Details: cues for sequence, posture and position from RW   Stairs Stairs: Yes Stairs assistance: Min guard;Supervision Stair Management: One rail Right;Step to pattern;Forwards;With crutches Number of Stairs: 20 General stair comments: cues for sequence and foot/crutch placement   Wheelchair Mobility     Modified Rankin (Stroke Patients Only)       Balance                                            Cognition Arousal/Alertness: Awake/alert Behavior During Therapy: WFL for tasks assessed/performed Overall Cognitive Status: Within Functional Limits for tasks assessed                                        Exercises Total Joint Exercises Ankle Circles/Pumps: AROM;Both;15 reps;Supine Quad Sets: AROM;Both;10 reps;Supine Heel Slides: AAROM;Left;10 reps    General Comments        Pertinent Vitals/Pain Pain Assessment: 0-10 Pain Score: 4  Pain Location: L knee Pain Descriptors / Indicators: Aching;Sore Pain Intervention(s): Monitored during session;Premedicated before session;Ice applied;Repositioned;Limited activity within patient's tolerance    Home Living                      Prior Function            PT Goals (current goals can now be found in the care plan section) Acute Rehab PT Goals Patient Stated Goal: Regain IND PT Goal Formulation: With patient Time For Goal Achievement: 01/12/18 Potential to Achieve Goals: Good Progress towards PT goals: Progressing toward goals    Frequency    7X/week      PT Plan Current plan remains appropriate    Co-evaluation              AM-PAC PT "6  Clicks" Daily Activity  Outcome Measure  Difficulty turning over in bed (including adjusting bedclothes, sheets and blankets)?: A Little Difficulty moving from lying on back to sitting on the side of the bed? : A Little Difficulty sitting down on and standing up from a chair with arms (e.g., wheelchair, bedside commode, etc,.)?: A Little Help needed moving to and from a bed to chair (including a wheelchair)?: A Little Help needed walking in hospital room?: A Little Help needed climbing 3-5 steps with a railing? : A Little 6 Click Score: 18    End of Session Equipment Utilized During Treatment: Gait belt;Left knee  immobilizer Activity Tolerance: Patient tolerated treatment well Patient left: in chair;with call bell/phone within reach;with family/visitor present Nurse Communication: Mobility status PT Visit Diagnosis: Difficulty in walking, not elsewhere classified (R26.2)     Time: 3545-6256 PT Time Calculation (min) (ACUTE ONLY): 32 min  Charges:  $Gait Training: 23-37 mins                    G CodesKenyon Ana, PT Pager: [phone] 01/07/2018    Kenyon Ana 01/07/2018, 10:50 AM

## 2018-01-08 ENCOUNTER — Encounter (HOSPITAL_COMMUNITY): Payer: Self-pay | Admitting: Orthopedic Surgery

## 2018-01-08 DIAGNOSIS — Z96652 Presence of left artificial knee joint: Secondary | ICD-10-CM | POA: Diagnosis not present

## 2018-01-08 DIAGNOSIS — Z471 Aftercare following joint replacement surgery: Secondary | ICD-10-CM | POA: Diagnosis not present

## 2018-01-08 DIAGNOSIS — F418 Other specified anxiety disorders: Secondary | ICD-10-CM | POA: Diagnosis not present

## 2018-01-08 DIAGNOSIS — I10 Essential (primary) hypertension: Secondary | ICD-10-CM | POA: Diagnosis not present

## 2018-01-08 DIAGNOSIS — E538 Deficiency of other specified B group vitamins: Secondary | ICD-10-CM | POA: Diagnosis not present

## 2018-01-08 DIAGNOSIS — E559 Vitamin D deficiency, unspecified: Secondary | ICD-10-CM | POA: Diagnosis not present

## 2018-01-10 DIAGNOSIS — I10 Essential (primary) hypertension: Secondary | ICD-10-CM | POA: Diagnosis not present

## 2018-01-10 DIAGNOSIS — E538 Deficiency of other specified B group vitamins: Secondary | ICD-10-CM | POA: Diagnosis not present

## 2018-01-10 DIAGNOSIS — F418 Other specified anxiety disorders: Secondary | ICD-10-CM | POA: Diagnosis not present

## 2018-01-10 DIAGNOSIS — Z471 Aftercare following joint replacement surgery: Secondary | ICD-10-CM | POA: Diagnosis not present

## 2018-01-10 DIAGNOSIS — E559 Vitamin D deficiency, unspecified: Secondary | ICD-10-CM | POA: Diagnosis not present

## 2018-01-10 DIAGNOSIS — Z96652 Presence of left artificial knee joint: Secondary | ICD-10-CM | POA: Diagnosis not present

## 2018-01-10 NOTE — Discharge Summary (Signed)
Patient ID: Stephanie Beasley MRN: [id number] DOB/AGE: [birth year] 77 y.o.  Admit date: 01/05/2018 Discharge date: 01/07/18  Admission Diagnoses:  Principal Problem:   Primary osteoarthritis of left knee   Discharge Diagnoses:  Same  Past Medical History:  Diagnosis Date  . Anal fissure   . Cataract    surgery to remove in 2016 -bilateral  . Depression with anxiety   . Diverticulitis of colon    ? 2011  . Esophageal stricture   . GERD (gastroesophageal reflux disease)   . Hyperlipidemia   . Hypertension   . Insomnia   . Osteoarthritis    knees  . Osteopenia   . Posterior vitreous detachment   . Tubular adenoma polyp of rectum   . Vitamin B12 deficiency   . Vitamin D insufficiency     Surgeries: Procedure(s): LEFT TOTAL KNEE ARTHROPLASTY on 01/05/2018   Consultants: None  Discharged Condition: Improved  Hospital Course: Stephanie Beasley is an 77 y.o. female who was admitted 01/05/2018 for operative treatment of Primary osteoarthritis of left knee. Patient has severe unremitting pain that affects sleep, daily activities, and work/hobbies. After pre-op clearance the patient was taken to the operating room on 01/05/2018 and underwent  Procedure(s): LEFT TOTAL KNEE ARTHROPLASTY.    Patient was given perioperative antibiotics:  Anti-infectives (From admission, onward)   Start     Dose/Rate Route Frequency Ordered Stop   01/05/18 1400  ceFAZolin (ANCEF) IVPB 2g/100 mL premix     2 g 200 mL/hr over 30 Minutes Intravenous Every 6 hours 01/05/18 1123 01/05/18 2030   01/05/18 0546  ceFAZolin (ANCEF) IVPB 2g/100 mL premix     2 g 200 mL/hr over 30 Minutes Intravenous On call to O.R. 01/05/18 9233 01/05/18 0745       Patient was given sequential compression devices, early ambulation to prevent DVT.  Patient benefited maximally from hospital stay and there were no complications.    Recent vital signs: BP 131/88 (BP Location: Left Arm)   Pulse 86   Temp 98.5 F (36.9 C) (Oral)    Resp 16   Ht 5\' 7"  (1.702 m)   Wt 79.8 kg (176 lb)   SpO2 96%   BMI 27.57 kg/m    Discharge Medications:   Allergies as of 01/07/2018   No Known Allergies     Medication List    STOP taking these medications   ibuprofen 200 MG tablet Commonly known as:  ADVIL,MOTRIN     TAKE these medications   alendronate 70 MG tablet Commonly known as:  FOSAMAX Take 70 mg by mouth every 14 (fourteen) days. Take with a full glass of water on an empty stomach.   ALPRAZolam 0.5 MG tablet Commonly known as:  XANAX Take 0.5 mg by mouth at bedtime.   aspirin EC 325 MG tablet Take 1 tablet (325 mg total) by mouth 2 (two) times daily after a meal. Take x 1 month post op to decrease risk of blood clots.   docusate sodium 100 MG capsule Commonly known as:  COLACE Take 1 capsule (100 mg total) by mouth 2 (two) times daily.   famotidine 20 MG tablet Commonly known as:  PEPCID Take 20 mg by mouth daily as needed for heartburn or indigestion.   Fish Oil 1200 MG Caps Take 1,200 mg by mouth daily.   HYDROcodone-acetaminophen 10-325 MG tablet Commonly known as:  NORCO Take 1-2 tablets by mouth every 6 (six) hours as needed for severe pain.  lisinopril 40 MG tablet Commonly known as:  PRINIVIL,ZESTRIL Take 40 mg by mouth daily.   loratadine 10 MG tablet Commonly known as:  CLARITIN Take 10 mg by mouth daily.   simvastatin 20 MG tablet Commonly known as:  ZOCOR Take 20 mg by mouth every evening.   SYSTANE BALANCE 0.6 % Soln Generic drug:  Propylene Glycol Place 1 drop into both eyes 2 (two) times daily.   tiZANidine 2 MG tablet Commonly known as:  ZANAFLEX Take 1 tablet (2 mg total) by mouth every 8 (eight) hours as needed for muscle spasms.   vitamin B-12 1000 MCG tablet Commonly known as:  CYANOCOBALAMIN Take 1,000 mcg by mouth daily.   Vitamin D3 5000 units Caps Take 5,000 Units by mouth daily.       Diagnostic Studies: Dg Chest 2 View  Result Date:  12/28/2017 CLINICAL DATA:  Preop knee replacement history of hypertension EXAM: CHEST - 2 VIEW COMPARISON:  Report 03/16/2000 FINDINGS: The heart size and mediastinal contours are within normal limits. Both lungs are clear. Mild degenerative changes of the spine. IMPRESSION: No active cardiopulmonary disease. Electronically Signed   By: Donavan Foil M.D.   On: 12/28/2017 15:34    Disposition: Discharge disposition: 01-Home or Self Care       Discharge Instructions    Discharge patient   Complete by:  As directed    Discharge disposition:  01-Home or Self Care   Discharge patient date:  01/07/2018     POD #2 s/pL TKA, doing well  - up with PT, encourage ambulation - D/C home today  - ASA 325 BID DVT prophylaxis - WBAT  - F/U in office 2 weeks    Signed: Justice Britain 01/10/2018, 1:20 PM

## 2018-01-11 DIAGNOSIS — Z96652 Presence of left artificial knee joint: Secondary | ICD-10-CM | POA: Diagnosis not present

## 2018-01-11 DIAGNOSIS — E538 Deficiency of other specified B group vitamins: Secondary | ICD-10-CM | POA: Diagnosis not present

## 2018-01-11 DIAGNOSIS — I10 Essential (primary) hypertension: Secondary | ICD-10-CM | POA: Diagnosis not present

## 2018-01-11 DIAGNOSIS — F418 Other specified anxiety disorders: Secondary | ICD-10-CM | POA: Diagnosis not present

## 2018-01-11 DIAGNOSIS — Z471 Aftercare following joint replacement surgery: Secondary | ICD-10-CM | POA: Diagnosis not present

## 2018-01-11 DIAGNOSIS — E559 Vitamin D deficiency, unspecified: Secondary | ICD-10-CM | POA: Diagnosis not present

## 2018-01-12 DIAGNOSIS — E559 Vitamin D deficiency, unspecified: Secondary | ICD-10-CM | POA: Diagnosis not present

## 2018-01-12 DIAGNOSIS — Z96652 Presence of left artificial knee joint: Secondary | ICD-10-CM | POA: Diagnosis not present

## 2018-01-12 DIAGNOSIS — Z471 Aftercare following joint replacement surgery: Secondary | ICD-10-CM | POA: Diagnosis not present

## 2018-01-12 DIAGNOSIS — I10 Essential (primary) hypertension: Secondary | ICD-10-CM | POA: Diagnosis not present

## 2018-01-12 DIAGNOSIS — E538 Deficiency of other specified B group vitamins: Secondary | ICD-10-CM | POA: Diagnosis not present

## 2018-01-12 DIAGNOSIS — F418 Other specified anxiety disorders: Secondary | ICD-10-CM | POA: Diagnosis not present

## 2018-01-15 DIAGNOSIS — M25562 Pain in left knee: Secondary | ICD-10-CM | POA: Diagnosis not present

## 2018-01-15 DIAGNOSIS — M1712 Unilateral primary osteoarthritis, left knee: Secondary | ICD-10-CM | POA: Diagnosis not present

## 2018-01-15 DIAGNOSIS — Z471 Aftercare following joint replacement surgery: Secondary | ICD-10-CM | POA: Diagnosis not present

## 2018-01-15 DIAGNOSIS — Z96652 Presence of left artificial knee joint: Secondary | ICD-10-CM | POA: Diagnosis not present

## 2018-01-15 DIAGNOSIS — M25662 Stiffness of left knee, not elsewhere classified: Secondary | ICD-10-CM | POA: Diagnosis not present

## 2018-01-18 DIAGNOSIS — M25662 Stiffness of left knee, not elsewhere classified: Secondary | ICD-10-CM | POA: Diagnosis not present

## 2018-01-18 DIAGNOSIS — Z96652 Presence of left artificial knee joint: Secondary | ICD-10-CM | POA: Diagnosis not present

## 2018-01-18 DIAGNOSIS — M25562 Pain in left knee: Secondary | ICD-10-CM | POA: Diagnosis not present

## 2018-01-23 DIAGNOSIS — M25562 Pain in left knee: Secondary | ICD-10-CM | POA: Diagnosis not present

## 2018-01-23 DIAGNOSIS — M25662 Stiffness of left knee, not elsewhere classified: Secondary | ICD-10-CM | POA: Diagnosis not present

## 2018-01-23 DIAGNOSIS — Z96652 Presence of left artificial knee joint: Secondary | ICD-10-CM | POA: Diagnosis not present

## 2018-01-25 DIAGNOSIS — Z96652 Presence of left artificial knee joint: Secondary | ICD-10-CM | POA: Diagnosis not present

## 2018-01-25 DIAGNOSIS — M25562 Pain in left knee: Secondary | ICD-10-CM | POA: Diagnosis not present

## 2018-01-25 DIAGNOSIS — M25662 Stiffness of left knee, not elsewhere classified: Secondary | ICD-10-CM | POA: Diagnosis not present

## 2018-01-30 DIAGNOSIS — Z96652 Presence of left artificial knee joint: Secondary | ICD-10-CM | POA: Diagnosis not present

## 2018-01-30 DIAGNOSIS — M25562 Pain in left knee: Secondary | ICD-10-CM | POA: Diagnosis not present

## 2018-01-30 DIAGNOSIS — M25662 Stiffness of left knee, not elsewhere classified: Secondary | ICD-10-CM | POA: Diagnosis not present

## 2018-02-02 DIAGNOSIS — M25562 Pain in left knee: Secondary | ICD-10-CM | POA: Diagnosis not present

## 2018-02-02 DIAGNOSIS — Z96652 Presence of left artificial knee joint: Secondary | ICD-10-CM | POA: Diagnosis not present

## 2018-02-02 DIAGNOSIS — M25662 Stiffness of left knee, not elsewhere classified: Secondary | ICD-10-CM | POA: Diagnosis not present

## 2018-02-06 DIAGNOSIS — Z96652 Presence of left artificial knee joint: Secondary | ICD-10-CM | POA: Diagnosis not present

## 2018-02-06 DIAGNOSIS — M25562 Pain in left knee: Secondary | ICD-10-CM | POA: Diagnosis not present

## 2018-02-06 DIAGNOSIS — M25662 Stiffness of left knee, not elsewhere classified: Secondary | ICD-10-CM | POA: Diagnosis not present

## 2018-02-08 DIAGNOSIS — M25662 Stiffness of left knee, not elsewhere classified: Secondary | ICD-10-CM | POA: Diagnosis not present

## 2018-02-08 DIAGNOSIS — M25562 Pain in left knee: Secondary | ICD-10-CM | POA: Diagnosis not present

## 2018-02-08 DIAGNOSIS — Z96652 Presence of left artificial knee joint: Secondary | ICD-10-CM | POA: Diagnosis not present

## 2018-02-13 DIAGNOSIS — M25562 Pain in left knee: Secondary | ICD-10-CM | POA: Diagnosis not present

## 2018-02-13 DIAGNOSIS — Z96652 Presence of left artificial knee joint: Secondary | ICD-10-CM | POA: Diagnosis not present

## 2018-02-13 DIAGNOSIS — Z9889 Other specified postprocedural states: Secondary | ICD-10-CM | POA: Diagnosis not present

## 2018-02-13 DIAGNOSIS — M25662 Stiffness of left knee, not elsewhere classified: Secondary | ICD-10-CM | POA: Diagnosis not present

## 2018-02-15 DIAGNOSIS — M25662 Stiffness of left knee, not elsewhere classified: Secondary | ICD-10-CM | POA: Diagnosis not present

## 2018-02-15 DIAGNOSIS — Z96652 Presence of left artificial knee joint: Secondary | ICD-10-CM | POA: Diagnosis not present

## 2018-02-15 DIAGNOSIS — M25562 Pain in left knee: Secondary | ICD-10-CM | POA: Diagnosis not present

## 2018-02-20 DIAGNOSIS — Z96652 Presence of left artificial knee joint: Secondary | ICD-10-CM | POA: Diagnosis not present

## 2018-02-20 DIAGNOSIS — M25662 Stiffness of left knee, not elsewhere classified: Secondary | ICD-10-CM | POA: Diagnosis not present

## 2018-02-20 DIAGNOSIS — M25562 Pain in left knee: Secondary | ICD-10-CM | POA: Diagnosis not present

## 2018-02-23 DIAGNOSIS — M25562 Pain in left knee: Secondary | ICD-10-CM | POA: Diagnosis not present

## 2018-02-23 DIAGNOSIS — Z96652 Presence of left artificial knee joint: Secondary | ICD-10-CM | POA: Diagnosis not present

## 2018-02-23 DIAGNOSIS — M25662 Stiffness of left knee, not elsewhere classified: Secondary | ICD-10-CM | POA: Diagnosis not present

## 2018-03-07 DIAGNOSIS — Z96652 Presence of left artificial knee joint: Secondary | ICD-10-CM | POA: Diagnosis not present

## 2018-03-07 DIAGNOSIS — M25562 Pain in left knee: Secondary | ICD-10-CM | POA: Diagnosis not present

## 2018-03-07 DIAGNOSIS — M25662 Stiffness of left knee, not elsewhere classified: Secondary | ICD-10-CM | POA: Diagnosis not present

## 2018-03-12 DIAGNOSIS — M25662 Stiffness of left knee, not elsewhere classified: Secondary | ICD-10-CM | POA: Diagnosis not present

## 2018-03-12 DIAGNOSIS — Z96652 Presence of left artificial knee joint: Secondary | ICD-10-CM | POA: Diagnosis not present

## 2018-03-12 DIAGNOSIS — M25562 Pain in left knee: Secondary | ICD-10-CM | POA: Diagnosis not present

## 2018-03-14 DIAGNOSIS — M25662 Stiffness of left knee, not elsewhere classified: Secondary | ICD-10-CM | POA: Diagnosis not present

## 2018-03-14 DIAGNOSIS — Z96652 Presence of left artificial knee joint: Secondary | ICD-10-CM | POA: Diagnosis not present

## 2018-03-14 DIAGNOSIS — M25562 Pain in left knee: Secondary | ICD-10-CM | POA: Diagnosis not present

## 2018-03-19 DIAGNOSIS — M25562 Pain in left knee: Secondary | ICD-10-CM | POA: Diagnosis not present

## 2018-03-19 DIAGNOSIS — M25662 Stiffness of left knee, not elsewhere classified: Secondary | ICD-10-CM | POA: Diagnosis not present

## 2018-03-19 DIAGNOSIS — Z96652 Presence of left artificial knee joint: Secondary | ICD-10-CM | POA: Diagnosis not present

## 2018-03-21 DIAGNOSIS — Z96652 Presence of left artificial knee joint: Secondary | ICD-10-CM | POA: Diagnosis not present

## 2018-03-21 DIAGNOSIS — M25562 Pain in left knee: Secondary | ICD-10-CM | POA: Diagnosis not present

## 2018-03-21 DIAGNOSIS — M25662 Stiffness of left knee, not elsewhere classified: Secondary | ICD-10-CM | POA: Diagnosis not present

## 2018-03-29 DIAGNOSIS — M25562 Pain in left knee: Secondary | ICD-10-CM | POA: Diagnosis not present

## 2018-03-29 DIAGNOSIS — M25662 Stiffness of left knee, not elsewhere classified: Secondary | ICD-10-CM | POA: Diagnosis not present

## 2018-03-29 DIAGNOSIS — Z96652 Presence of left artificial knee joint: Secondary | ICD-10-CM | POA: Diagnosis not present

## 2018-04-05 DIAGNOSIS — M1711 Unilateral primary osteoarthritis, right knee: Secondary | ICD-10-CM | POA: Diagnosis not present

## 2018-04-05 DIAGNOSIS — Z96652 Presence of left artificial knee joint: Secondary | ICD-10-CM | POA: Diagnosis not present

## 2018-04-05 DIAGNOSIS — M25562 Pain in left knee: Secondary | ICD-10-CM | POA: Diagnosis not present

## 2018-04-05 DIAGNOSIS — Z471 Aftercare following joint replacement surgery: Secondary | ICD-10-CM | POA: Diagnosis not present

## 2018-05-16 DIAGNOSIS — H524 Presbyopia: Secondary | ICD-10-CM | POA: Diagnosis not present

## 2018-05-16 DIAGNOSIS — H43813 Vitreous degeneration, bilateral: Secondary | ICD-10-CM | POA: Diagnosis not present

## 2018-05-17 DIAGNOSIS — M25562 Pain in left knee: Secondary | ICD-10-CM | POA: Diagnosis not present

## 2018-05-17 DIAGNOSIS — M25561 Pain in right knee: Secondary | ICD-10-CM | POA: Diagnosis not present

## 2018-05-28 DIAGNOSIS — Z23 Encounter for immunization: Secondary | ICD-10-CM | POA: Diagnosis not present

## 2018-06-08 DIAGNOSIS — H26493 Other secondary cataract, bilateral: Secondary | ICD-10-CM | POA: Diagnosis not present

## 2018-06-08 DIAGNOSIS — H02831 Dermatochalasis of right upper eyelid: Secondary | ICD-10-CM | POA: Diagnosis not present

## 2018-06-08 DIAGNOSIS — H02834 Dermatochalasis of left upper eyelid: Secondary | ICD-10-CM | POA: Diagnosis not present

## 2018-06-08 DIAGNOSIS — H527 Unspecified disorder of refraction: Secondary | ICD-10-CM | POA: Diagnosis not present

## 2018-06-08 DIAGNOSIS — Z961 Presence of intraocular lens: Secondary | ICD-10-CM | POA: Diagnosis not present

## 2018-06-08 DIAGNOSIS — H1859 Other hereditary corneal dystrophies: Secondary | ICD-10-CM | POA: Diagnosis not present

## 2018-06-08 DIAGNOSIS — H43813 Vitreous degeneration, bilateral: Secondary | ICD-10-CM | POA: Diagnosis not present

## 2018-06-13 ENCOUNTER — Other Ambulatory Visit: Payer: Self-pay | Admitting: Internal Medicine

## 2018-06-13 DIAGNOSIS — Z1231 Encounter for screening mammogram for malignant neoplasm of breast: Secondary | ICD-10-CM

## 2018-07-13 ENCOUNTER — Ambulatory Visit (INDEPENDENT_AMBULATORY_CARE_PROVIDER_SITE_OTHER): Payer: Medicare Other

## 2018-07-13 DIAGNOSIS — Z1231 Encounter for screening mammogram for malignant neoplasm of breast: Secondary | ICD-10-CM | POA: Diagnosis not present

## 2018-07-16 DIAGNOSIS — H524 Presbyopia: Secondary | ICD-10-CM | POA: Diagnosis not present

## 2018-07-16 DIAGNOSIS — H43813 Vitreous degeneration, bilateral: Secondary | ICD-10-CM | POA: Diagnosis not present

## 2018-07-17 DIAGNOSIS — Z96652 Presence of left artificial knee joint: Secondary | ICD-10-CM | POA: Diagnosis not present

## 2018-07-17 DIAGNOSIS — M25561 Pain in right knee: Secondary | ICD-10-CM | POA: Diagnosis not present

## 2018-07-17 DIAGNOSIS — M1711 Unilateral primary osteoarthritis, right knee: Secondary | ICD-10-CM | POA: Diagnosis not present

## 2018-07-18 ENCOUNTER — Other Ambulatory Visit: Payer: Self-pay | Admitting: Orthopedic Surgery

## 2018-07-25 ENCOUNTER — Encounter (HOSPITAL_COMMUNITY): Payer: Self-pay

## 2018-07-25 NOTE — Pre-Procedure Instructions (Signed)
Stephanie Beasley  07/25/2018      [id number] [place], [place] - [address] [place] [address] Phone: [phone] Fax: [phone]    Your procedure is scheduled on Monday, Dec. 2nd   Report to [facility] Admitting at 12:45 PM             (posted surgery time 2:45p - 4:42p)   Call this number if you have problems the morning of surgery:  [phone]   Remember:   Do not eat any foods or drink any liquids after midnight, Sunday.               4-5 days prior to surgery, STOP TAKING any Vitamins, Herbal Supplements, Anti-inflammatories.    Take these medicines the morning of surgery with A SIP OF WATER : Xanax - if needed    Do not wear jewelry, make-up or nail polish.  Do not wear lotions, powders, perfumes, or deodorant.  Do not shave 48 hours prior to surgery.    Do not bring valuables to the hospital.  [facility] is not responsible for any belongings or valuables.  Contacts, dentures or bridgework may not be worn into surgery.  Leave your suitcase in the car.  After surgery it may be brought to your room.  For patients admitted to the hospital, discharge time will be determined by your treatment team.  Please read over the following fact sheets that you were given. Pain Booklet, MRSA Information and Surgical Site Infection Prevention       [facility]- Preparing For Surgery  Before surgery, you can play an important role. Because skin is not sterile, your skin needs to be as free of germs as possible. You can reduce the number of germs on your skin by washing with CHG (chlorahexidine gluconate) Soap before surgery.  CHG is an antiseptic cleaner which kills germs and bonds with the skin to continue killing germs even after washing.    Oral Hygiene is also important to reduce your risk of infection.    Remember - BRUSH YOUR TEETH THE MORNING OF SURGERY WITH YOUR REGULAR TOOTHPASTE  Please do not use if you have  an allergy to CHG or antibacterial soaps. If your skin becomes reddened/irritated stop using the CHG.  Do not shave (including legs and underarms) for at least 48 hours prior to first CHG shower. It is OK to shave your face.  Please follow these instructions carefully.   1. Shower the NIGHT BEFORE SURGERY and the MORNING OF SURGERY with CHG.   2. If you chose to wash your hair, wash your hair first as usual with your normal shampoo.  3. After you shampoo, rinse your hair and body thoroughly to remove the shampoo.  4. Use CHG as you would any other liquid soap. You can apply CHG directly to the skin and wash gently with a scrungie or a clean washcloth.   5. Apply the CHG Soap to your body ONLY FROM THE NECK DOWN.  Do not use on open wounds or open sores. Avoid contact with your eyes, ears, mouth and genitals (private parts). Wash Face and genitals (private parts)  with your normal soap.  6. Wash thoroughly, paying special attention to the area where your surgery will be performed.  7. Thoroughly rinse your body with warm water from the neck down.  8. DO NOT shower/wash with your normal soap after using and rinsing off the CHG Soap.  9. Pat yourself dry with a CLEAN TOWEL.  10. Wear CLEAN PAJAMAS to bed the night before surgery, wear comfortable clothes the morning of surgery  11. Place CLEAN SHEETS on your bed the night of your first shower and DO NOT SLEEP WITH PETS.  Day of Surgery:  Do not apply any deodorants/lotions.  Please wear clean clothes to the hospital/surgery center.   Remember to brush your teeth WITH YOUR REGULAR TOOTHPASTE.

## 2018-07-26 ENCOUNTER — Encounter (HOSPITAL_COMMUNITY)
Admission: RE | Admit: 2018-07-26 | Discharge: 2018-07-26 | Disposition: A | Discharge status: Home / Self Care | Payer: Medicare Other | Source: Ambulatory Visit | Attending: Orthopedic Surgery | Admitting: Orthopedic Surgery

## 2018-07-26 ENCOUNTER — Other Ambulatory Visit: Payer: Self-pay

## 2018-07-26 ENCOUNTER — Encounter (HOSPITAL_COMMUNITY): Payer: Self-pay

## 2018-07-26 DIAGNOSIS — Z01818 Encounter for other preprocedural examination: Secondary | ICD-10-CM | POA: Insufficient documentation

## 2018-07-26 LAB — TYPE AND SCREEN
ABO/RH(D): O POS
ANTIBODY SCREEN: NEGATIVE

## 2018-07-26 LAB — CBC WITH DIFFERENTIAL/PLATELET
Abs Immature Granulocytes: 0.02 10*3/uL (ref 0.00–0.07)
BASOS PCT: 0 %
Basophils Absolute: 0 10*3/uL (ref 0.0–0.1)
EOS ABS: 0.1 10*3/uL (ref 0.0–0.5)
EOS PCT: 3 %
HCT: 44.6 % (ref 36.0–46.0)
Hemoglobin: 13.7 g/dL (ref 12.0–15.0)
Immature Granulocytes: 0 %
Lymphocytes Relative: 36 %
Lymphs Abs: 1.8 10*3/uL (ref 0.7–4.0)
MCH: 28.9 pg (ref 26.0–34.0)
MCHC: 30.7 g/dL (ref 30.0–36.0)
MCV: 94.1 fL (ref 80.0–100.0)
Monocytes Absolute: 0.5 10*3/uL (ref 0.1–1.0)
Monocytes Relative: 10 %
NEUTROS PCT: 51 %
NRBC: 0 % (ref 0.0–0.2)
Neutro Abs: 2.6 10*3/uL (ref 1.7–7.7)
PLATELETS: 250 10*3/uL (ref 150–400)
RBC: 4.74 MIL/uL (ref 3.87–5.11)
RDW: 12.5 % (ref 11.5–15.5)
WBC: 5 10*3/uL (ref 4.0–10.5)

## 2018-07-26 LAB — APTT: APTT: 27 s (ref 24–36)

## 2018-07-26 LAB — URINALYSIS, ROUTINE W REFLEX MICROSCOPIC
BILIRUBIN URINE: NEGATIVE
Glucose, UA: NEGATIVE mg/dL
HGB URINE DIPSTICK: NEGATIVE
Ketones, ur: NEGATIVE mg/dL
Leukocytes, UA: NEGATIVE
Nitrite: NEGATIVE
PH: 6 (ref 5.0–8.0)
Protein, ur: NEGATIVE mg/dL
Specific Gravity, Urine: 1.008 (ref 1.005–1.030)

## 2018-07-26 LAB — COMPREHENSIVE METABOLIC PANEL
ALBUMIN: 4 g/dL (ref 3.5–5.0)
ALT: 16 U/L (ref 0–44)
ANION GAP: 10 (ref 5–15)
AST: 24 U/L (ref 15–41)
Alkaline Phosphatase: 62 U/L (ref 38–126)
BILIRUBIN TOTAL: 1 mg/dL (ref 0.3–1.2)
BUN: 13 mg/dL (ref 8–23)
CHLORIDE: 108 mmol/L (ref 98–111)
CO2: 22 mmol/L (ref 22–32)
Calcium: 9.3 mg/dL (ref 8.9–10.3)
Creatinine, Ser: 1.06 mg/dL — ABNORMAL HIGH (ref 0.44–1.00)
GFR calc Af Amer: 57 mL/min — ABNORMAL LOW (ref >60)
GFR calc non Af Amer: 49 mL/min — ABNORMAL LOW (ref >60)
GLUCOSE: 102 mg/dL — AB (ref 70–99)
POTASSIUM: 4 mmol/L (ref 3.5–5.1)
SODIUM: 140 mmol/L (ref 135–145)
Total Protein: 6.7 g/dL (ref 6.5–8.1)

## 2018-07-26 LAB — ABO/RH: ABO/RH(D): O POS

## 2018-07-26 LAB — PROTIME-INR
INR: 0.97
PROTHROMBIN TIME: 12.8 s (ref 11.4–15.2)

## 2018-07-26 LAB — SURGICAL PCR SCREEN
MRSA, PCR: NEGATIVE
Staphylococcus aureus: NEGATIVE

## 2018-07-26 NOTE — Progress Notes (Signed)
PCP  Dr. Shon Baton  Denies any cardiac history or testing,never seen a cardiologist.

## 2018-07-31 ENCOUNTER — Other Ambulatory Visit: Payer: Self-pay | Admitting: Orthopedic Surgery

## 2018-07-31 NOTE — Care Plan (Signed)
Spoke with patient prior to surgery. Well known to this writer from prior surgery. She will discharge to home with family and HHPT. She has equipment from last surgery. CPM has been ordered.   Renee Angiulli,RNCM  [phone]

## 2018-08-03 MED ORDER — TRANEXAMIC ACID-NACL 1000-0.7 MG/100ML-% IV SOLN
1000.0000 mg | INTRAVENOUS | Status: AC
  Administered 2018-08-06: 1000 mg via INTRAVENOUS
  Filled 2018-08-03: qty 100

## 2018-08-03 MED ORDER — BUPIVACAINE LIPOSOME 1.3 % IJ SUSP
20.0000 mL | INTRAMUSCULAR | Status: AC
  Administered 2018-08-06: 20 mL
  Filled 2018-08-03: qty 20

## 2018-08-06 ENCOUNTER — Ambulatory Visit (HOSPITAL_COMMUNITY): Payer: Medicare Other | Admitting: Certified Registered Nurse Anesthetist

## 2018-08-06 ENCOUNTER — Encounter (HOSPITAL_COMMUNITY)
Admission: RE | Disposition: A | Discharge status: Home / Self Care | Payer: Self-pay | Source: Ambulatory Visit | Attending: Orthopedic Surgery

## 2018-08-06 ENCOUNTER — Encounter (HOSPITAL_COMMUNITY): Payer: Self-pay | Admitting: Certified Registered Nurse Anesthetist

## 2018-08-06 ENCOUNTER — Ambulatory Visit (HOSPITAL_COMMUNITY)
Admission: RE | Admit: 2018-08-06 | Discharge: 2018-08-07 | Disposition: A | Discharge status: Home / Self Care | Payer: Medicare Other | Source: Ambulatory Visit | Attending: Orthopedic Surgery | Admitting: Orthopedic Surgery

## 2018-08-06 DIAGNOSIS — I10 Essential (primary) hypertension: Secondary | ICD-10-CM | POA: Insufficient documentation

## 2018-08-06 DIAGNOSIS — G8918 Other acute postprocedural pain: Secondary | ICD-10-CM | POA: Diagnosis not present

## 2018-08-06 DIAGNOSIS — M1711 Unilateral primary osteoarthritis, right knee: Secondary | ICD-10-CM

## 2018-08-06 HISTORY — PX: TOTAL KNEE ARTHROPLASTY: SHX125

## 2018-08-06 SURGERY — ARTHROPLASTY, KNEE, TOTAL
Anesthesia: Spinal | Site: Knee | Laterality: Right

## 2018-08-06 MED ORDER — MAGNESIUM CITRATE PO SOLN: 1.0000 | Freq: Once | ORAL | Status: DC | PRN

## 2018-08-06 MED ORDER — DEXAMETHASONE SODIUM PHOSPHATE 10 MG/ML IJ SOLN
10.0000 mg | Freq: Two times a day (BID) | INTRAMUSCULAR | Status: DC
  Administered 2018-08-07 (×2): 10 mg via INTRAVENOUS
  Filled 2018-08-06 (×2): qty 1

## 2018-08-06 MED ORDER — ASPIRIN EC 325 MG PO TBEC: 325.0000 mg | DELAYED_RELEASE_TABLET | Freq: Two times a day (BID) | ORAL | 0 refills | Status: DC

## 2018-08-06 MED ORDER — BUPIVACAINE IN DEXTROSE 0.75-8.25 % IT SOLN
INTRATHECAL | Status: DC | PRN
  Administered 2018-08-06: 1.8 mL via INTRATHECAL

## 2018-08-06 MED ORDER — CELECOXIB 200 MG PO CAPS
200.0000 mg | ORAL_CAPSULE | Freq: Two times a day (BID) | ORAL | Status: DC
  Administered 2018-08-06 – 2018-08-07 (×2): 200 mg via ORAL
  Filled 2018-08-06 (×2): qty 1

## 2018-08-06 MED ORDER — DEXAMETHASONE SODIUM PHOSPHATE 10 MG/ML IJ SOLN
INTRAMUSCULAR | Status: DC | PRN
  Administered 2018-08-06: 10 mg via INTRAVENOUS

## 2018-08-06 MED ORDER — SODIUM CHLORIDE 0.9 % IV SOLN
INTRAVENOUS | Status: DC | PRN
  Administered 2018-08-06: 30 ug/min via INTRAVENOUS

## 2018-08-06 MED ORDER — FENTANYL CITRATE (PF) 100 MCG/2ML IJ SOLN
50.0000 ug | Freq: Once | INTRAMUSCULAR | Status: AC
  Administered 2018-08-06: 50 ug via INTRAVENOUS

## 2018-08-06 MED ORDER — CEFAZOLIN SODIUM-DEXTROSE 2-4 GM/100ML-% IV SOLN
2.0000 g | INTRAVENOUS | Status: AC
  Administered 2018-08-06: 2 g via INTRAVENOUS

## 2018-08-06 MED ORDER — CHLORHEXIDINE GLUCONATE 4 % EX LIQD: 60.0000 mL | Freq: Once | CUTANEOUS | Status: DC

## 2018-08-06 MED ORDER — ONDANSETRON HCL 4 MG/2ML IJ SOLN: 4.0000 mg | Freq: Once | INTRAMUSCULAR | Status: DC | PRN

## 2018-08-06 MED ORDER — DIPHENHYDRAMINE HCL 12.5 MG/5ML PO ELIX: 12.5000 mg | ORAL_SOLUTION | ORAL | Status: DC | PRN

## 2018-08-06 MED ORDER — SODIUM CHLORIDE (PF) 0.9 % IJ SOLN
INTRAMUSCULAR | Status: DC | PRN
  Administered 2018-08-06: 50 mL

## 2018-08-06 MED ORDER — PROPOFOL 10 MG/ML IV BOLUS
INTRAVENOUS | Status: DC | PRN
  Administered 2018-08-06: 30 mg via INTRAVENOUS

## 2018-08-06 MED ORDER — CEFAZOLIN SODIUM-DEXTROSE 2-4 GM/100ML-% IV SOLN
INTRAVENOUS | Status: AC
  Filled 2018-08-06: qty 100

## 2018-08-06 MED ORDER — ONDANSETRON HCL 4 MG PO TABS: 4.0000 mg | ORAL_TABLET | Freq: Four times a day (QID) | ORAL | Status: DC | PRN

## 2018-08-06 MED ORDER — BUPIVACAINE HCL (PF) 0.5 % IJ SOLN
INTRAMUSCULAR | Status: DC | PRN
  Administered 2018-08-06: 30 mL

## 2018-08-06 MED ORDER — BUPIVACAINE HCL (PF) 0.5 % IJ SOLN
INTRAMUSCULAR | Status: AC
  Filled 2018-08-06: qty 30

## 2018-08-06 MED ORDER — FENTANYL CITRATE (PF) 100 MCG/2ML IJ SOLN
INTRAMUSCULAR | Status: DC | PRN
  Administered 2018-08-06: 25 ug via INTRAVENOUS

## 2018-08-06 MED ORDER — 0.9 % SODIUM CHLORIDE (POUR BTL) OPTIME
TOPICAL | Status: DC | PRN
  Administered 2018-08-06: 1000 mL

## 2018-08-06 MED ORDER — SODIUM CHLORIDE 0.9 % IR SOLN
Status: DC | PRN
  Administered 2018-08-06: 3000 mL

## 2018-08-06 MED ORDER — ONDANSETRON HCL 4 MG/2ML IJ SOLN: 4.0000 mg | Freq: Four times a day (QID) | INTRAMUSCULAR | Status: DC | PRN

## 2018-08-06 MED ORDER — OXYCODONE HCL 5 MG PO TABS
5.0000 mg | ORAL_TABLET | ORAL | Status: DC | PRN
  Administered 2018-08-07 (×3): 10 mg via ORAL
  Filled 2018-08-06 (×3): qty 2

## 2018-08-06 MED ORDER — OXYCODONE-ACETAMINOPHEN 5-325 MG PO TABS: 1.0000 | ORAL_TABLET | Freq: Four times a day (QID) | ORAL | 0 refills | Status: DC | PRN

## 2018-08-06 MED ORDER — LISINOPRIL 40 MG PO TABS
40.0000 mg | ORAL_TABLET | Freq: Every day | ORAL | Status: DC
  Administered 2018-08-07: 40 mg via ORAL
  Filled 2018-08-06: qty 1

## 2018-08-06 MED ORDER — MIDAZOLAM HCL 2 MG/2ML IJ SOLN
2.0000 mg | Freq: Once | INTRAMUSCULAR | Status: AC
  Administered 2018-08-06: 2 mg via INTRAVENOUS

## 2018-08-06 MED ORDER — DOCUSATE SODIUM 100 MG PO CAPS: 100.0000 mg | ORAL_CAPSULE | Freq: Two times a day (BID) | ORAL | 0 refills | Status: DC

## 2018-08-06 MED ORDER — METHOCARBAMOL 1000 MG/10ML IJ SOLN
500.0000 mg | Freq: Four times a day (QID) | INTRAVENOUS | Status: DC | PRN
  Filled 2018-08-06: qty 5

## 2018-08-06 MED ORDER — TIZANIDINE HCL 2 MG PO TABS: 2.0000 mg | ORAL_TABLET | Freq: Three times a day (TID) | ORAL | 0 refills | Status: DC | PRN

## 2018-08-06 MED ORDER — SODIUM CHLORIDE 0.9 % IV SOLN: INTRAVENOUS | Status: DC

## 2018-08-06 MED ORDER — FENTANYL CITRATE (PF) 100 MCG/2ML IJ SOLN
INTRAMUSCULAR | Status: AC
  Administered 2018-08-06: 50 ug via INTRAVENOUS
  Filled 2018-08-06: qty 2

## 2018-08-06 MED ORDER — FENTANYL CITRATE (PF) 100 MCG/2ML IJ SOLN: 25.0000 ug | INTRAMUSCULAR | Status: DC | PRN

## 2018-08-06 MED ORDER — OXYCODONE HCL 5 MG PO TABS: 5.0000 mg | ORAL_TABLET | Freq: Once | ORAL | Status: DC | PRN

## 2018-08-06 MED ORDER — ASPIRIN EC 325 MG PO TBEC
325.0000 mg | DELAYED_RELEASE_TABLET | Freq: Two times a day (BID) | ORAL | Status: DC
  Administered 2018-08-07: 325 mg via ORAL
  Filled 2018-08-06: qty 1

## 2018-08-06 MED ORDER — ONDANSETRON HCL 4 MG/2ML IJ SOLN
INTRAMUSCULAR | Status: DC | PRN
  Administered 2018-08-06: 4 mg via INTRAVENOUS

## 2018-08-06 MED ORDER — POLYETHYLENE GLYCOL 3350 17 G PO PACK: 17.0000 g | PACK | Freq: Every day | ORAL | Status: DC | PRN

## 2018-08-06 MED ORDER — MIDAZOLAM HCL 2 MG/2ML IJ SOLN
INTRAMUSCULAR | Status: AC
  Administered 2018-08-06: 2 mg via INTRAVENOUS
  Filled 2018-08-06: qty 2

## 2018-08-06 MED ORDER — ACETAMINOPHEN 325 MG PO TABS: 325.0000 mg | ORAL_TABLET | Freq: Four times a day (QID) | ORAL | Status: DC | PRN

## 2018-08-06 MED ORDER — POLYVINYL ALCOHOL 1.4 % OP SOLN
1.0000 [drp] | Freq: Two times a day (BID) | OPHTHALMIC | Status: DC
  Administered 2018-08-06 – 2018-08-07 (×2): 1 [drp] via OPHTHALMIC
  Filled 2018-08-06: qty 15

## 2018-08-06 MED ORDER — OXYCODONE HCL 5 MG/5ML PO SOLN: 5.0000 mg | Freq: Once | ORAL | Status: DC | PRN

## 2018-08-06 MED ORDER — BISACODYL 5 MG PO TBEC: 5.0000 mg | DELAYED_RELEASE_TABLET | Freq: Every day | ORAL | Status: DC | PRN

## 2018-08-06 MED ORDER — LACTATED RINGERS IV SOLN
INTRAVENOUS | Status: DC
  Administered 2018-08-06: 12:00:00 via INTRAVENOUS

## 2018-08-06 MED ORDER — ALUM & MAG HYDROXIDE-SIMETH 200-200-20 MG/5ML PO SUSP: 30.0000 mL | ORAL | Status: DC | PRN

## 2018-08-06 MED ORDER — PHENYLEPHRINE HCL 10 MG/ML IJ SOLN
INTRAMUSCULAR | Status: DC | PRN
  Administered 2018-08-06: 80 ug via INTRAVENOUS

## 2018-08-06 MED ORDER — DOCUSATE SODIUM 100 MG PO CAPS
100.0000 mg | ORAL_CAPSULE | Freq: Two times a day (BID) | ORAL | Status: DC
  Administered 2018-08-06 – 2018-08-07 (×2): 100 mg via ORAL
  Filled 2018-08-06 (×2): qty 1

## 2018-08-06 MED ORDER — HYDROMORPHONE HCL 1 MG/ML IJ SOLN: 0.5000 mg | INTRAMUSCULAR | Status: DC | PRN

## 2018-08-06 MED ORDER — ROPIVACAINE HCL 5 MG/ML IJ SOLN
INTRAMUSCULAR | Status: DC | PRN
  Administered 2018-08-06: 30 mL via PERINEURAL

## 2018-08-06 MED ORDER — PROPOFOL 500 MG/50ML IV EMUL
INTRAVENOUS | Status: DC | PRN
  Administered 2018-08-06: 75 ug/kg/min via INTRAVENOUS

## 2018-08-06 MED ORDER — CEFAZOLIN SODIUM-DEXTROSE 2-4 GM/100ML-% IV SOLN
2.0000 g | Freq: Four times a day (QID) | INTRAVENOUS | Status: AC
  Administered 2018-08-06 – 2018-08-07 (×2): 2 g via INTRAVENOUS
  Filled 2018-08-06 (×2): qty 100

## 2018-08-06 MED ORDER — TRANEXAMIC ACID-NACL 1000-0.7 MG/100ML-% IV SOLN
1000.0000 mg | Freq: Once | INTRAVENOUS | Status: AC
  Administered 2018-08-06: 1000 mg via INTRAVENOUS
  Filled 2018-08-06: qty 100

## 2018-08-06 MED ORDER — ALPRAZOLAM 0.5 MG PO TABS
0.5000 mg | ORAL_TABLET | Freq: Every day | ORAL | Status: DC
  Administered 2018-08-06: 0.5 mg via ORAL
  Filled 2018-08-06: qty 1

## 2018-08-06 MED ORDER — METHOCARBAMOL 500 MG PO TABS: 500.0000 mg | ORAL_TABLET | Freq: Four times a day (QID) | ORAL | Status: DC | PRN

## 2018-08-06 MED ORDER — GABAPENTIN 300 MG PO CAPS
300.0000 mg | ORAL_CAPSULE | Freq: Two times a day (BID) | ORAL | Status: DC
  Administered 2018-08-06 – 2018-08-07 (×2): 300 mg via ORAL
  Filled 2018-08-06 (×2): qty 1

## 2018-08-06 MED ORDER — FENTANYL CITRATE (PF) 250 MCG/5ML IJ SOLN
INTRAMUSCULAR | Status: AC
  Filled 2018-08-06: qty 5

## 2018-08-06 SURGICAL SUPPLY — 74 items
APL SKNCLS STERI-STRIP NONHPOA (GAUZE/BANDAGES/DRESSINGS) ×1
ATTUNE MED DOME PAT 38 KNEE (Knees) ×1 IMPLANT
ATTUNE MED DOME PAT 38MM KNEE (Knees) ×1 IMPLANT
ATTUNE PS FEM RT SZ 5 CEM KNEE (Femur) ×2 IMPLANT
ATTUNE PSRP INSR SZ5 5 KNEE (Insert) ×1 IMPLANT
ATTUNE PSRP INSR SZ5 5MM KNEE (Insert) ×1 IMPLANT
BANDAGE ESMARK 6X9 LF (GAUZE/BANDAGES/DRESSINGS) ×1 IMPLANT
BASE TIBIA ATTUNE KNEE SYS SZ6 (Knees) IMPLANT
BENZOIN TINCTURE PRP APPL 2/3 (GAUZE/BANDAGES/DRESSINGS) ×3 IMPLANT
BLADE SAGITTAL 25.0X1.19X90 (BLADE) ×2 IMPLANT
BLADE SAGITTAL 25.0X1.19X90MM (BLADE) ×1
BLADE SAW SAG 90X13X1.27 (BLADE) ×3 IMPLANT
BNDG CMPR 9X6 STRL LF SNTH (GAUZE/BANDAGES/DRESSINGS) ×1
BNDG CMPR MED 10X6 ELC LF (GAUZE/BANDAGES/DRESSINGS) ×1
BNDG ELASTIC 6X10 VLCR STRL LF (GAUZE/BANDAGES/DRESSINGS) ×2 IMPLANT
BNDG ESMARK 6X9 LF (GAUZE/BANDAGES/DRESSINGS) ×3
BOWL SMART MIX CTS (DISPOSABLE) ×3 IMPLANT
BSPLAT TIB 6 CMNT ROT PLAT STR (Knees) ×1 IMPLANT
CEMENT HV SMART SET (Cement) ×6 IMPLANT
CLOSURE WOUND 1/2 X4 (GAUZE/BANDAGES/DRESSINGS)
COVER SURGICAL LIGHT HANDLE (MISCELLANEOUS) ×3 IMPLANT
COVER WAND RF STERILE (DRAPES) ×3 IMPLANT
CUFF TOURNIQUET SINGLE 34IN LL (TOURNIQUET CUFF) ×3 IMPLANT
CUFF TOURNIQUET SINGLE 44IN (TOURNIQUET CUFF) IMPLANT
DRAPE EXTREMITY T 121X128X90 (DRAPE) ×3 IMPLANT
DRAPE U-SHAPE 47X51 STRL (DRAPES) ×3 IMPLANT
DRSG AQUACEL AG ADV 3.5X10 (GAUZE/BANDAGES/DRESSINGS) ×2 IMPLANT
DRSG PAD ABDOMINAL 8X10 ST (GAUZE/BANDAGES/DRESSINGS) ×3 IMPLANT
DURAPREP 26ML APPLICATOR (WOUND CARE) ×3 IMPLANT
ELECT REM PT RETURN 9FT ADLT (ELECTROSURGICAL) ×3
ELECTRODE REM PT RTRN 9FT ADLT (ELECTROSURGICAL) ×1 IMPLANT
EVACUATOR 1/8 PVC DRAIN (DRAIN) IMPLANT
FACESHIELD WRAPAROUND (MASK) ×3 IMPLANT
FACESHIELD WRAPAROUND OR TEAM (MASK) ×1 IMPLANT
GAUZE SPONGE 4X4 12PLY STRL (GAUZE/BANDAGES/DRESSINGS) ×3 IMPLANT
GLOVE BIOGEL PI IND STRL 8 (GLOVE) ×2 IMPLANT
GLOVE BIOGEL PI INDICATOR 8 (GLOVE) ×4
GLOVE ECLIPSE 7.5 STRL STRAW (GLOVE) ×6 IMPLANT
GOWN STRL REUS W/ TWL LRG LVL3 (GOWN DISPOSABLE) ×1 IMPLANT
GOWN STRL REUS W/ TWL XL LVL3 (GOWN DISPOSABLE) ×2 IMPLANT
GOWN STRL REUS W/TWL LRG LVL3 (GOWN DISPOSABLE) ×3
GOWN STRL REUS W/TWL XL LVL3 (GOWN DISPOSABLE) ×6
HANDPIECE INTERPULSE COAX TIP (DISPOSABLE) ×3
HOOD PEEL AWAY FACE SHEILD DIS (HOOD) ×6 IMPLANT
IMMOBILIZER KNEE 22 (SOFTGOODS) ×2 IMPLANT
IMMOBILIZER KNEE 22 UNIV (SOFTGOODS) ×3 IMPLANT
KIT BASIN OR (CUSTOM PROCEDURE TRAY) ×3 IMPLANT
KIT TURNOVER KIT B (KITS) ×3 IMPLANT
MANIFOLD NEPTUNE II (INSTRUMENTS) ×3 IMPLANT
NEEDLE 22X1 1/2 (OR ONLY) (NEEDLE) ×3 IMPLANT
NS IRRIG 1000ML POUR BTL (IV SOLUTION) ×3 IMPLANT
PACK TOTAL JOINT (CUSTOM PROCEDURE TRAY) ×3 IMPLANT
PAD ABD 8X10 STRL (GAUZE/BANDAGES/DRESSINGS) ×3 IMPLANT
PAD ARMBOARD 7.5X6 YLW CONV (MISCELLANEOUS) ×6 IMPLANT
PADDING CAST COTTON 6X4 STRL (CAST SUPPLIES) ×2 IMPLANT
PIN STEINMAN FIXATION KNEE (PIN) ×2 IMPLANT
PIN THREADED HEADED SIGMA (PIN) ×2 IMPLANT
SET HNDPC FAN SPRY TIP SCT (DISPOSABLE) ×1 IMPLANT
STAPLER VISISTAT 35W (STAPLE) IMPLANT
STRIP CLOSURE SKIN 1/2X4 (GAUZE/BANDAGES/DRESSINGS) IMPLANT
SUCTION FRAZIER HANDLE 10FR (MISCELLANEOUS) ×2
SUCTION TUBE FRAZIER 10FR DISP (MISCELLANEOUS) ×1 IMPLANT
SUT MNCRL AB 3-0 PS2 18 (SUTURE) IMPLANT
SUT VIC AB 0 CTB1 27 (SUTURE) ×6 IMPLANT
SUT VIC AB 1 CT1 27 (SUTURE) ×6
SUT VIC AB 1 CT1 27XBRD ANBCTR (SUTURE) ×2 IMPLANT
SUT VIC AB 2-0 CTB1 (SUTURE) ×6 IMPLANT
SYR CONTROL 10ML LL (SYRINGE) ×6 IMPLANT
TIBIA ATTUNE KNEE SYS BASE SZ6 (Knees) ×3 IMPLANT
TOWEL OR 17X24 6PK STRL BLUE (TOWEL DISPOSABLE) ×3 IMPLANT
TOWEL OR 17X26 10 PK STRL BLUE (TOWEL DISPOSABLE) ×3 IMPLANT
TRAY CATH 16FR W/PLASTIC CATH (SET/KITS/TRAYS/PACK) ×2 IMPLANT
TRAY FOLEY MTR SLVR 16FR STAT (SET/KITS/TRAYS/PACK) IMPLANT
WRAP KNEE MAXI GEL POST OP (GAUZE/BANDAGES/DRESSINGS) ×3 IMPLANT

## 2018-08-06 NOTE — Transfer of Care (Signed)
Immediate Anesthesia Transfer of Care Note  Patient: Stephanie Beasley  Procedure(s) Performed: TOTAL KNEE ARTHROPLASTY (Right Knee)  Patient Location: PACU  Anesthesia Type:Spinal  Level of Consciousness: awake, alert  and oriented  Airway & Oxygen Therapy: Patient Spontanous Breathing  Post-op Assessment: Report given to RN and Post -op Vital signs reviewed and stable  Post vital signs: Reviewed and stable  Last Vitals:  Vitals Value Taken Time  BP 86/54 08/06/2018  4:22 PM  Temp    Pulse 67 08/06/2018  4:23 PM  Resp 14 08/06/2018  4:23 PM  SpO2 99 % 08/06/2018  4:23 PM  Vitals shown include unvalidated device data.  Last Pain:  Vitals:   08/06/18 1620  TempSrc:   PainSc: (P) 0-No pain         Complications: No apparent anesthesia complications

## 2018-08-06 NOTE — Brief Op Note (Signed)
08/06/2018  10:49 PM  PATIENT:  Hatley R Dragoo  77 y.o. female  PRE-OPERATIVE DIAGNOSIS:  OSTEOARTHRITIS RIGHT KNEE  POST-OPERATIVE DIAGNOSIS:  OSTEOARTHRITIS RIGHT KNEE  PROCEDURE:  Procedure(s): TOTAL KNEE ARTHROPLASTY (Right)  SURGEON:  Surgeon(s) and Role:    Dorna Leitz, MD - Primary  PHYSICIAN ASSISTANT:   ASSISTANTS: bethune   ANESTHESIA:   general  EBL:  10 mL   BLOOD ADMINISTERED:none  DRAINS: none   LOCAL MEDICATIONS USED:  MARCAINE    and OTHER experel  SPECIMEN:  No Specimen  DISPOSITION OF SPECIMEN:  N/A  COUNTS:  YES  TOURNIQUET:   Total Tourniquet Time Documented: Thigh (Right) - 53 minutes Total: Thigh (Right) - 53 minutes   DICTATION: .Other Dictation: Dictation Number [id number]  PLAN OF CARE: Admit for overnight observation  PATIENT DISPOSITION:  PACU - hemodynamically stable.   Delay start of Pharmacological VTE agent (>24hrs) due to surgical blood loss or risk of bleeding: no

## 2018-08-06 NOTE — Anesthesia Procedure Notes (Signed)
Procedure Name: MAC Date/Time: 08/06/2018 2:37 PM Performed by: Julieta Bellini, CRNA Pre-anesthesia Checklist: Patient identified, Emergency Drugs available, Suction available and Patient being monitored Patient Re-evaluated:Patient Re-evaluated prior to induction Oxygen Delivery Method: Simple face mask Preoxygenation: Pre-oxygenation with 100% oxygen

## 2018-08-06 NOTE — H&P (Signed)
TOTAL KNEE ADMISSION H&P  Patient is being admitted for right total knee arthroplasty.  Subjective:  Chief Complaint:right knee pain.  HPI: Stephanie Beasley, 77 y.o. female, has a history of pain and functional disability in the right knee due to arthritis and has failed non-surgical conservative treatments for greater than 12 weeks to includeNSAID's and/or analgesics, corticosteriod injections, viscosupplementation injections, weight reduction as appropriate and activity modification.  Onset of symptoms was gradual, starting 5 years ago with gradually worsening course since that time. The patient noted no past surgery on the right knee(s).  Patient currently rates pain in the right knee(s) at 9 out of 10 with activity. Patient has night pain, worsening of pain with activity and weight bearing, pain that interferes with activities of daily living, pain with passive range of motion and joint swelling.  Patient has evidence of subchondral sclerosis, periarticular osteophytes and joint space narrowing by imaging studies. This patient has had failure of all conservative care. There is no active infection.  Patient Active Problem List   Diagnosis Date Noted  . Primary osteoarthritis of left knee 01/05/2018   Past Medical History:  Diagnosis Date  . Anal fissure   . Cataract    surgery to remove in 2016 -bilateral  . Depression with anxiety   . Diverticulitis of colon    ? 2011  . Esophageal stricture   . GERD (gastroesophageal reflux disease)   . Hyperlipidemia   . Hypertension   . Insomnia   . Osteoarthritis    knees  . Osteopenia   . Posterior vitreous detachment   . Tubular adenoma polyp of rectum   . Vitamin B12 deficiency   . Vitamin D insufficiency     Past Surgical History:  Procedure Laterality Date  . APPENDECTOMY    . BREAST CYST ASPIRATION    . COLONOSCOPY  02/2011    polyps/tics/hems  . eye surgery Bilateral 01/2015   remove cataracts  . ROTATOR CUFF REPAIR Bilateral     x 2  . TOTAL ABDOMINAL HYSTERECTOMY W/ BILATERAL SALPINGOOPHORECTOMY    . TOTAL KNEE ARTHROPLASTY Left 01/05/2018   Procedure: LEFT TOTAL KNEE ARTHROPLASTY;  Surgeon: Dorna Leitz, MD;  Location: [facility];  Service: Orthopedics;  Laterality: Left;  . WISDOM TOOTH EXTRACTION      Current Facility-Administered Medications  Medication Dose Route Frequency Provider Last Rate Last Dose  . bupivacaine liposome (EXPAREL) 1.3 % injection 266 mg  20 mL Infiltration To OR Gretta Samons, MD      . ceFAZolin (ANCEF) 2-4 GM/100ML-% IVPB           . ceFAZolin (ANCEF) IVPB 2g/100 mL premix  2 g Intravenous On Call to OR Dorna Leitz, MD      . chlorhexidine (HIBICLENS) 4 % liquid 4 application  60 mL Topical Once Dorna Leitz, MD      . fentaNYL (SUBLIMAZE) 100 MCG/2ML injection           . midazolam (VERSED) 2 MG/2ML injection           . tranexamic acid (CYKLOKAPRON) IVPB 1,000 mg  1,000 mg Intravenous To OR Dorna Leitz, MD       Allergies  Allergen Reactions  . Hydrocodone Other (See Comments)    Causes patient to get the shakes    Social History   Tobacco Use  . Smoking status: Never Smoker  . Smokeless tobacco: Never Used  Substance Use Topics  . Alcohol use: No    Family History  Problem Relation Age of Onset  . COPD Mother   . Gout Unknown   . Diabetes type II Unknown   . Stroke Unknown   . Breast cancer Unknown        grandmother  . Colon cancer Neg Hx   . Colon polyps Neg Hx   . Rectal cancer Neg Hx   . Stomach cancer Neg Hx      ROS ROS: I have reviewed the patient's review of systems thoroughly and there are no positive responses as relates to the HPI. Objective:  Physical Exam  Vital signs in last 24 hours: Temp:  [98.7 F (37.1 C)] 98.7 F (37.1 C) (12/02 1157) Pulse Rate:  [80] 80 (12/02 1157) Resp:  [20] 20 (12/02 1157) BP: (165)/(87) 165/87 (12/02 1157) SpO2:  [96 %] 96 % (12/02 1157) Well-developed well-nourished patient in no acute distress. Alert and  oriented x3 HEENT:within normal limits Cardiac: Regular rate and rhythm Pulmonary: Lungs clear to auscultation Abdomen: Soft and nontender.  Normal active bowel sounds  Musculoskeletal: (R KNEE: +med jt line tender +painful and limited rom Labs: Recent Results (from the past 2160 hour(s))  APTT     Status: None   Collection Time: 07/26/18 10:44 AM  Result Value Ref Range   aPTT 27 24 - 36 seconds    Comment: Performed at [facility] [address], [place], [place] [address]  CBC WITH DIFFERENTIAL     Status: None   Collection Time: 07/26/18 10:44 AM  Result Value Ref Range   WBC 5.0 4.0 - 10.5 K/uL   RBC 4.74 3.87 - 5.11 MIL/uL   Hemoglobin 13.7 12.0 - 15.0 g/dL   HCT 44.6 36.0 - 46.0 %   MCV 94.1 80.0 - 100.0 fL   MCH 28.9 26.0 - 34.0 pg   MCHC 30.7 30.0 - 36.0 g/dL   RDW 12.5 11.5 - 15.5 %   Platelets 250 150 - 400 K/uL   nRBC 0.0 0.0 - 0.2 %   Neutrophils Relative % 51 %   Neutro Abs 2.6 1.7 - 7.7 K/uL   Lymphocytes Relative 36 %   Lymphs Abs 1.8 0.7 - 4.0 K/uL   Monocytes Relative 10 %   Monocytes Absolute 0.5 0.1 - 1.0 K/uL   Eosinophils Relative 3 %   Eosinophils Absolute 0.1 0.0 - 0.5 K/uL   Basophils Relative 0 %   Basophils Absolute 0.0 0.0 - 0.1 K/uL   Immature Granulocytes 0 %   Abs Immature Granulocytes 0.02 0.00 - 0.07 K/uL    Comment: Performed at [facility], [address], [place], [place] [address]  Comprehensive metabolic panel     Status: Abnormal   Collection Time: 07/26/18 10:44 AM  Result Value Ref Range   Sodium 140 135 - 145 mmol/L   Potassium 4.0 3.5 - 5.1 mmol/L   Chloride 108 98 - 111 mmol/L   CO2 22 22 - 32 mmol/L   Glucose, Bld 102 (H) 70 - 99 mg/dL   BUN 13 8 - 23 mg/dL   Creatinine, Ser 1.06 (H) 0.44 - 1.00 mg/dL   Calcium 9.3 8.9 - 10.3 mg/dL   Total Protein 6.7 6.5 - 8.1 g/dL   Albumin 4.0 3.5 - 5.0 g/dL   AST 24 15 - 41 U/L   ALT 16 0 - 44 U/L   Alkaline Phosphatase 62 38 - 126 U/L   Total Bilirubin 1.0 0.3 -  1.2 mg/dL   GFR calc non  Af Amer 49 (L) >60 mL/min   GFR calc Af Amer 57 (L) >60 mL/min    Comment: (NOTE) The eGFR has been calculated using the CKD EPI equation. This calculation has not been validated in all clinical situations. eGFR's persistently <60 mL/min signify possible Chronic Kidney Disease.    Anion gap 10 5 - 15    Comment: Performed at [facility] [address] [place], [place] [address]  Protime-INR     Status: None   Collection Time: 07/26/18 10:44 AM  Result Value Ref Range   Prothrombin Time 12.8 11.4 - 15.2 seconds   INR 0.97     Comment: Performed at [facility] [address], [place], [place] [address]  Urinalysis, Routine w reflex microscopic     Status: Abnormal   Collection Time: 07/26/18 10:45 AM  Result Value Ref Range   Color, Urine STRAW (A) YELLOW   APPearance CLEAR CLEAR   Specific Gravity, Urine 1.008 1.005 - 1.030   pH 6.0 5.0 - 8.0   Glucose, UA NEGATIVE NEGATIVE mg/dL   Hgb urine dipstick NEGATIVE NEGATIVE   Bilirubin Urine NEGATIVE NEGATIVE   Ketones, ur NEGATIVE NEGATIVE mg/dL   Protein, ur NEGATIVE NEGATIVE mg/dL   Nitrite NEGATIVE NEGATIVE   Leukocytes, UA NEGATIVE NEGATIVE    Comment: Performed at [facility] [address] [place], [place] [address]  Surgical pcr screen     Status: None   Collection Time: 07/26/18 10:46 AM  Result Value Ref Range   MRSA, PCR NEGATIVE NEGATIVE   Staphylococcus aureus NEGATIVE NEGATIVE    Comment: (NOTE) The Xpert SA Assay (FDA approved for NASAL specimens in patients 53 years of age and older), is one component of a comprehensive surveillance program. It is not intended to diagnose infection nor to guide or monitor treatment. Performed at [facility], [place] [address], [place], [place] [address]   Type and screen Order type and screen if day of surgery is less than 15 days from draw of preadmission visit or order morning of surgery if day of surgery is greater than 6  days from preadmission visit.     Status: None   Collection Time: 07/26/18 10:55 AM  Result Value Ref Range   ABO/RH(D) O POS    Antibody Screen NEG    Sample Expiration 08/09/2018    Extend sample reason      NO TRANSFUSIONS OR PREGNANCY IN THE PAST 3 MONTHS Performed at [facility], [place] [address], [place], [place] [address]   ABO/Rh     Status: None   Collection Time: 07/26/18 10:55 AM  Result Value Ref Range   ABO/RH(D)      O POS Performed at [facility] [address] [place], [place] [address]     Estimated body mass index is 28.41 kg/m as calculated from the following:   Height as of 07/26/18: _0  (1.702 m).   Weight as of 07/26/18: 82.3 kg.   Imaging Review Plain radiographs demonstrate severe degenerative joint disease of the right knee(s). The overall alignment ismild varus. The bone quality appears to be fair for age and reported activity level.   Preoperative templating of the joint replacement has been completed, documented, and submitted to the Operating Room personnel in order to optimize intra-operative equipment management.    Patient's anticipated LOS is less than 2 midnights, meeting these requirements: - Younger than 74 - Lives within 1 hour  of care - Has a competent adult at home to recover with post-op recover - NO history of  - Chronic pain requiring opiods  - Diabetes  - Coronary Artery Disease  - Heart failure  - Heart attack  - Stroke  - DVT/VTE  - Cardiac arrhythmia  - Respiratory Failure/COPD  - Renal failure  - Anemia  - Advanced Liver disease        Assessment/Plan:  End stage arthritis, right knee   The patient history, physical examination, clinical judgment of the provider and imaging studies are consistent with end stage degenerative joint disease of the right knee(s) and total knee arthroplasty is deemed medically necessary. The treatment options including medical management, injection therapy  arthroscopy and arthroplasty were discussed at length. The risks and benefits of total knee arthroplasty were presented and reviewed. The risks due to aseptic loosening, infection, stiffness, patella tracking problems, thromboembolic complications and other imponderables were discussed. The patient acknowledged the explanation, agreed to proceed with the plan and consent was signed. Patient is being admitted for inpatient treatment for surgery, pain control, PT, OT, prophylactic antibiotics, VTE prophylaxis, progressive ambulation and ADL's and discharge planning. The patient is planning to be discharged home with home health services

## 2018-08-06 NOTE — Progress Notes (Signed)
Orthopedic Tech Progress Note Patient Details:  Stephanie Beasley [birth year] [id number]  CPM Right Knee CPM Right Knee: On Right Knee Flexion (Degrees): 90 Right Knee Extension (Degrees): 0  Post Interventions Patient Tolerated: Well Instructions Provided: Care of device, Adjustment of device  [name] 08/06/2018, 5:49 PM

## 2018-08-06 NOTE — Care Plan (Signed)
Ortho Bundle Case Management Note  Patient Details  Name: Stephanie Beasley MRN: [id number] Date of Birth: [birth year]  Spoke with patient prior to surgery. Well known to this writer from prior surgery. She will discharge to home with family and HHPT. She has equipment from last surgery. CPM has been ordered.                   DME Arranged:  CPM DME Agency:  [employer]  HH Arranged:  PT [place] Agency:  [facility]  Additional Comments: Please contact me with any questions of if this plan should need to change.  Ladell Heads,  [facility]  [phone] 08/06/2018, 4:19 PM

## 2018-08-06 NOTE — Discharge Instructions (Signed)

## 2018-08-06 NOTE — Anesthesia Preprocedure Evaluation (Addendum)
Anesthesia Evaluation  Patient identified by MRN, date of birth, ID band Patient awake    Reviewed: Allergy & Precautions, NPO status , Patient's Chart, lab work & pertinent test results  History of Anesthesia Complications Negative for: history of anesthetic complications  Airway Mallampati: III  TM Distance: >3 FB Neck ROM: Full    Dental no notable dental hx.    Pulmonary neg pulmonary ROS,    Pulmonary exam normal        Cardiovascular hypertension, Normal cardiovascular exam     Neuro/Psych PSYCHIATRIC DISORDERS Anxiety negative neurological ROS     GI/Hepatic Neg liver ROS, GERD  ,Esophageal stricture   Endo/Other  negative endocrine ROS  Renal/GU negative Renal ROS  negative genitourinary   Musculoskeletal  (+) Arthritis ,   Abdominal   Peds  Hematology negative hematology ROS (+)   Anesthesia Other Findings   Reproductive/Obstetrics                            Anesthesia Physical Anesthesia Plan  ASA: II  Anesthesia Plan: Spinal   Post-op Pain Management:  Regional for Post-op pain   Induction:   PONV Risk Score and Plan: 2 and Propofol infusion, TIVA, Midazolam and Treatment may vary due to age or medical condition  Airway Management Planned: Nasal Cannula and Simple Face Mask  Additional Equipment: None  Intra-op Plan:   Post-operative Plan:   Informed Consent: I have reviewed the patients History and Physical, chart, labs and discussed the procedure including the risks, benefits and alternatives for the proposed anesthesia with the patient or authorized representative who has indicated his/her understanding and acceptance.     Plan Discussed with:   Anesthesia Plan Comments:        Anesthesia Quick Evaluation

## 2018-08-06 NOTE — OR Nursing (Signed)
1610: in&out cath=200cc cyu, per protocol

## 2018-08-06 NOTE — Anesthesia Procedure Notes (Signed)
Anesthesia Regional Block: Adductor canal block   Pre-Anesthetic Checklist: ,, timeout performed, Correct Patient, Correct Site, Correct Laterality, Correct Procedure, Correct Position, site marked, Risks and benefits discussed,  Surgical consent,  Pre-op evaluation,  At surgeon's request and post-op pain management  Laterality: Right  Prep: chloraprep       Needles:  Injection technique: Single-shot  Needle Type: Echogenic Stimulator Needle     Needle Length: 9cm  Needle Gauge: 21     Additional Needles:   Procedures:,,,, ultrasound used (permanent image in chart),,,,  Narrative:  Start time: 08/06/2018 1:35 PM End time: 08/06/2018 1:41 PM Injection made incrementally with aspirations every 5 mL.  Performed by: Personally  Anesthesiologist: Lidia Collum, MD  Additional Notes: Monitors applied. Injection made in 5cc increments. No resistance to injection. Good needle visualization. Patient tolerated procedure well.

## 2018-08-07 ENCOUNTER — Other Ambulatory Visit: Payer: Self-pay

## 2018-08-07 ENCOUNTER — Encounter (HOSPITAL_COMMUNITY): Payer: Self-pay

## 2018-08-07 DIAGNOSIS — M1711 Unilateral primary osteoarthritis, right knee: Secondary | ICD-10-CM | POA: Diagnosis not present

## 2018-08-07 DIAGNOSIS — I10 Essential (primary) hypertension: Secondary | ICD-10-CM | POA: Diagnosis not present

## 2018-08-07 LAB — CBC
HCT: 36.8 % (ref 36.0–46.0)
HEMOGLOBIN: 12.2 g/dL (ref 12.0–15.0)
MCH: 29.9 pg (ref 26.0–34.0)
MCHC: 33.2 g/dL (ref 30.0–36.0)
MCV: 90.2 fL (ref 80.0–100.0)
Platelets: 199 10*3/uL (ref 150–400)
RBC: 4.08 MIL/uL (ref 3.87–5.11)
RDW: 12.4 % (ref 11.5–15.5)
WBC: 8.8 10*3/uL (ref 4.0–10.5)
nRBC: 0 % (ref 0.0–0.2)

## 2018-08-07 NOTE — Op Note (Signed)
NAME: Stephanie Beasley, Stephanie Beasley MEDICAL RECORD PI:[id number] ACCOUNT [id number] DATE OF BIRTH:[birth year] FACILITY: [facility] LOCATION: [facility] PHYSICIAN:Austynn Pridmore L. Mishael Haran, MD  OPERATIVE REPORT  DATE OF PROCEDURE:  08/06/2018  PREOPERATIVE DIAGNOSIS:  End-stage degenerative joint disease, right knee.  POSTOPERATIVE DIAGNOSIS:  End-stage degenerative joint disease, right knee.  PROCEDURE:  Right total knee replacement with an Attune system, size 5 femur, size 6 tibia, 5 mm bridging bearing, and a 38 mm all polyethylene patella.  SURGEON:  Dorna Leitz, MD  ASSISTANT:  Gaspar Skeeters PA-C, who was present for the entire case and assisted by retraction, bone cuts, and closing to minimize OR time.  BRIEF HISTORY:  The patient is a 77 year old female with a long history significant for bilateral knee pain.  She had bone-on-bone change in both knees with her left knee several months ago.  She has done great with that and after failure of conservative  care for her right knee and because the patient was having bone-on-bone change by x-ray and light activity pain and night pain, she was taken to the operating room for right total knee replacement.  DESCRIPTION OF PROCEDURE:  The patient was taken to the operating room after adequate anesthesia was obtained with a spinal anesthetic, the patient was placed supine on the operating table.  The right leg was then prepped and draped in the usual sterile  fashion.  Following this, the leg was exsanguinated, blood pressure tourniquet inflated to 300 mmHg.  Following this, a midline incision was made in subcutaneous tissue dissected down to the level of the extensor mechanism.  A medial parapatellar  arthrotomy was undertaken.  Following this, medial and lateral meniscus were removed, retropatellar fat pad, synovium on the anterior aspect of the femur, and the anterior and posterior cruciates.  At this point, an intramedullary pilot hole was drilled  in the femur and a 4 degree  valgus inclination cut was made with 9 mm distal bone resected.  Following this, the femur was sized to a 5.  Anterior and posterior cuts were made chamfers and box.  Attention was then turned to the tibia.  It was cut  perpendicular to its long axis with a 3-degree posterior slope.  It was sized to a #6.  It was drilled and keeled.  Following this, attention was turned to the patella, 9.5 mm of bone was removed and a 38 paddle was chosen and the lugs were drilled for  this patella size.  Following this, the knee was put through a range of motion.  Excellent range of motion and stability were achieved and following this, the trial components were removed.  The knee was copiously and thoroughly lavaged with pulsatile  lavage irrigation and suctioned dry.  The final components were then cemented into place, size 6 tibia, size 5 femur, 5 mm bridging bearing trial was placed and a 41 all poly patella was placed and held with a clamp.  The patellar button was placed and  held with a clamp.  All excess bone cement was removed and cement was allowed to completely harden.  Once the cement was completely hardened, the tourniquet was let down, all bleeders controlled with electrocautery and the final 5 mm poly was opened and  placed.  A mixture of 20 mL of Exparel, 30 mL 0.5% Marcaine, and 50 mL of saline were placed with 30 mL in the posterior aspect of the knee and posterior capsule and the remainder throughout the capsular reflection throughout the rest of the knee.  Once  the medial parapatellar arthrotomy was closed after the final poly was placed, the subcutaneous tissue was closed with 0 and 2-0 Vicryl and the skin with 3-0 Monocryl subcuticular.  Benzoin and Steri-Strips were applied.  Sterile compressive dressing was  applied.  The patient was taken to recovery was noted to be in satisfactory condition.  Estimated blood loss for procedure was minimal.  Of note, Gaspar Skeeters was present throughout the entire  case.  He assisted by retracting, doing bone cuts, and  closing to minimize OR time.  TN/NUANCE  D:08/06/2018 T:08/07/2018 JOB:[id number]

## 2018-08-07 NOTE — Care Management (Signed)
Patient is Ortho Bundle, all services have been arranged by MD office Case Manager.

## 2018-08-07 NOTE — Plan of Care (Signed)
Problem: Education: Goal: Knowledge of General Education information will improve Description Including pain rating scale, medication(s)/side effects and non-pharmacologic comfort measures 08/07/2018 1612 by Lurline Idol, RN Outcome: Adequate for Discharge 08/07/2018 0740 by Lurline Idol, RN Outcome: Progressing   Problem: Health Behavior/Discharge Planning: Goal: Ability to manage health-related needs will improve 08/07/2018 1612 by Lurline Idol, RN Outcome: Adequate for Discharge 08/07/2018 0740 by Lurline Idol, RN Outcome: Progressing   Problem: Clinical Measurements: Goal: Ability to maintain clinical measurements within normal limits will improve 08/07/2018 1612 by Lurline Idol, RN Outcome: Adequate for Discharge 08/07/2018 0740 by Lurline Idol, RN Outcome: Progressing Goal: Will remain free from infection 08/07/2018 1612 by Lurline Idol, RN Outcome: Adequate for Discharge 08/07/2018 0740 by Lurline Idol, RN Outcome: Progressing Goal: Respiratory complications will improve 08/07/2018 1612 by Lurline Idol, RN Outcome: Adequate for Discharge 08/07/2018 0740 by Lurline Idol, RN Outcome: Progressing Goal: Cardiovascular complication will be avoided 08/07/2018 1612 by Lurline Idol, RN Outcome: Adequate for Discharge 08/07/2018 0740 by Lurline Idol, RN Outcome: Progressing   Problem: Activity: Goal: Risk for activity intolerance will decrease 08/07/2018 1612 by Lurline Idol, RN Outcome: Adequate for Discharge 08/07/2018 0740 by Lurline Idol, RN Outcome: Progressing   Problem: Nutrition: Goal: Adequate nutrition will be maintained 08/07/2018 1612 by Lurline Idol, RN Outcome: Adequate for Discharge 08/07/2018 0740 by Lurline Idol, RN Outcome: Progressing   Problem: Coping: Goal: Level of anxiety will decrease 08/07/2018 1612 by Lurline Idol, RN Outcome: Adequate for Discharge 08/07/2018 0740 by Lurline Idol, RN Outcome: Progressing    Problem: Elimination: Goal: Will not experience complications related to bowel motility 08/07/2018 1612 by Lurline Idol, RN Outcome: Adequate for Discharge 08/07/2018 0740 by Lurline Idol, RN Outcome: Progressing Goal: Will not experience complications related to urinary retention 08/07/2018 1612 by Lurline Idol, RN Outcome: Adequate for Discharge 08/07/2018 0740 by Lurline Idol, RN Outcome: Progressing   Problem: Pain Managment: Goal: General experience of comfort will improve 08/07/2018 1612 by Lurline Idol, RN Outcome: Adequate for Discharge 08/07/2018 0740 by Lurline Idol, RN Outcome: Progressing   Problem: Safety: Goal: Ability to remain free from injury will improve 08/07/2018 1612 by Lurline Idol, RN Outcome: Adequate for Discharge 08/07/2018 0740 by Lurline Idol, RN Outcome: Progressing   Problem: Skin Integrity: Goal: Risk for impaired skin integrity will decrease 08/07/2018 1612 by Lurline Idol, RN Outcome: Adequate for Discharge 08/07/2018 0740 by Lurline Idol, RN Outcome: Progressing   Problem: Activity: Goal: Risk for activity intolerance will decrease 08/07/2018 1612 by Lurline Idol, RN Outcome: Adequate for Discharge 08/07/2018 0740 by Lurline Idol, RN Outcome: Progressing   Problem: Coping: Goal: Level of anxiety will decrease 08/07/2018 1612 by Lurline Idol, RN Outcome: Adequate for Discharge 08/07/2018 0740 by Lurline Idol, RN Outcome: Progressing   Problem: Nutrition: Goal: Adequate nutrition will be maintained 08/07/2018 1612 by Lurline Idol, RN Outcome: Adequate for Discharge 08/07/2018 0740 by Lurline Idol, RN Outcome: Progressing   Problem: Elimination: Goal: Will not experience complications related to bowel motility 08/07/2018 1612 by Lurline Idol, RN Outcome: Adequate for Discharge 08/07/2018 0740 by Lurline Idol, RN Outcome: Progressing   Problem: Safety: Goal: Ability to remain free from injury will  improve 08/07/2018 1612 by Lurline Idol, RN Outcome: Adequate for Discharge 08/07/2018 0740 by Lurline Idol, RN Outcome: Progressing

## 2018-08-07 NOTE — Evaluation (Signed)
Physical Therapy Evaluation Patient Details Name: Stephanie Beasley MRN: [id number] DOB: [birth year] Today's Date: 08/07/2018   History of Present Illness  77yo female who received R TKA on 08/06/18, also note recent history of L TKA 01/05/18. PMH HTN, RCR, L TKA   Clinical Impression  Patient received in bed, very pleasant and motivated to participate in therapy today. R knee AROM in supine: extension 14 degrees, flexion 85 degrees. Knee immobilizer applied totalA in bed for time management, then patient able to perform bed mobility with min guard to support R LE. Performed first sit to stand at EOB with min guard and RW, then she abruptly lost balance requiring quick return to sitting at side of bed. Able to perform second sit to stand with min guard and able to maintain balance this time with close min guard and extended time standing at bedside, then able to gait train approximately 132f with RW, min guard, and cues for gait mechanics and upright posture as well as sequencing with RW. She was left up in the recliner with all needs met and all questions/concerns addressed this morning. Plan to initiate stair training and review of HEP this afternoon.     Follow Up Recommendations Follow surgeon's recommendation for DC plan and follow-up therapies    Equipment Recommendations  None recommended by PT(has all necessary DME )    Recommendations for Other Services       Precautions / Restrictions Precautions Precautions: Fall Required Braces or Orthoses: Knee Immobilizer - Right Knee Immobilizer - Right: On when out of bed or walking Restrictions Weight Bearing Restrictions: Yes RLE Weight Bearing: Weight bearing as tolerated      Mobility  Bed Mobility Overal bed mobility: Needs Assistance Bed Mobility: Supine to Sit     Supine to sit: Min guard     General bed mobility comments: min guard to support R LE   Transfers Overall transfer level: Needs assistance Equipment used: Rolling  walker (2 wheeled) Transfers: Sit to/from Stand Sit to Stand: Min guard         General transfer comment: min guard for safety; on first attempt with standing, patient came to full stand and then lost balance, sitting back down on bed quickly. On second attempt able to stand with  min guard and took extra time to get balance   Ambulation/Gait Ambulation/Gait assistance: Min guard Gait Distance (Feet): 150 Feet Assistive device: Rolling walker (2 wheeled) Gait Pattern/deviations: Step-to pattern;Decreased step length - left;Decreased stance time - right;Decreased dorsiflexion - right;Decreased dorsiflexion - left;Decreased weight shift to right;Narrow base of support Gait velocity: decreased    General Gait Details: cues for upright posture, heel-toe mechanics, and seqeuncing with RW; gait distance limited by fatigue   Stairs            Wheelchair Mobility    Modified Rankin (Stroke Patients Only)       Balance Overall balance assessment: Needs assistance Sitting-balance support: Bilateral upper extremity supported;Feet supported Sitting balance-Leahy Scale: Good     Standing balance support: Bilateral upper extremity supported;During functional activity Standing balance-Leahy Scale: Poor Standing balance comment: reliant on B UE support, had one LOB requiring quickly sitting back down on bed                              Pertinent Vitals/Pain Pain Assessment: No/denies pain    Home Living Family/patient expects to be discharged to:: Private residence Living Arrangements: Spouse/significant  other Available Help at Discharge: Family;Available 24 hours/day Type of Home: House Home Access: Stairs to enter Entrance Stairs-Rails: Right Entrance Stairs-Number of Steps: 13 Home Layout: Two level Home Equipment: Walker - 2 wheels;Bedside commode;Crutches;Cane - single point;Tub bench      Prior Function Level of Independence: Independent                Hand Dominance        Extremity/Trunk Assessment   Upper Extremity Assessment Upper Extremity Assessment: Overall WFL for tasks assessed    Lower Extremity Assessment Lower Extremity Assessment: Generalized weakness    Cervical / Trunk Assessment Cervical / Trunk Assessment: Normal  Communication   Communication: No difficulties  Cognition Arousal/Alertness: Awake/alert Behavior During Therapy: WFL for tasks assessed/performed Overall Cognitive Status: Within Functional Limits for tasks assessed                                        General Comments      Exercises Total Joint Exercises Goniometric ROM: R knee extension AROM in supine 14 degrees, flexion AROM in supine 85 degrees    Assessment/Plan    PT Assessment Patient needs continued PT services  PT Problem List Decreased strength;Decreased range of motion;Decreased knowledge of use of DME;Decreased activity tolerance;Decreased safety awareness;Decreased balance;Decreased mobility;Decreased coordination       PT Treatment Interventions DME instruction;Balance training;Gait training;Neuromuscular re-education;Stair training;Functional mobility training;Patient/family education;Therapeutic activities;Therapeutic exercise;Manual techniques    PT Goals (Current goals can be found in the Care Plan section)  Acute Rehab PT Goals Patient Stated Goal: go home, start HHPT  PT Goal Formulation: With patient Time For Goal Achievement: 08/21/18 Potential to Achieve Goals: Good    Frequency 7X/week   Barriers to discharge        Co-evaluation               AM-PAC PT "6 Clicks" Mobility  Outcome Measure Help needed turning from your back to your side while in a flat bed without using bedrails?: None Help needed moving from lying on your back to sitting on the side of a flat bed without using bedrails?: A Little Help needed moving to and from a bed to a chair (including a wheelchair)?: A  Little Help needed standing up from a chair using your arms (e.g., wheelchair or bedside chair)?: A Little Help needed to walk in hospital room?: A Little Help needed climbing 3-5 steps with a railing? : A Little 6 Click Score: 19    End of Session Equipment Utilized During Treatment: Gait belt;Right knee immobilizer Activity Tolerance: Patient limited by fatigue;Patient tolerated treatment well Patient left: in chair;with call bell/phone within reach   PT Visit Diagnosis: Unsteadiness on feet (R26.81);Difficulty in walking, not elsewhere classified (R26.2);Muscle weakness (generalized) (M62.81)    Time: 4287-6811 PT Time Calculation (min) (ACUTE ONLY): 25 min   Charges:   PT Evaluation $PT Eval Low Complexity: 1 Low PT Treatments $Gait Training: 8-22 mins        Deniece Ree PT, DPT, CBIS  Supplemental Physical Therapist [place]    Pager [phone] Acute Rehab Office [phone]

## 2018-08-07 NOTE — Discharge Summary (Signed)
Patient ID: Stephanie Beasley MRN: [id number] DOB/AGE: 77-15-1942 77 y.o.  Admit date: 08/06/2018 Discharge date: 08/07/2018  Admission Diagnoses:  Principal Problem:   Primary osteoarthritis of right knee   Discharge Diagnoses:  Same  Past Medical History:  Diagnosis Date  . Anal fissure   . Cataract    surgery to remove in 2016 -bilateral  . Depression with anxiety   . Diverticulitis of colon    ? 2011  . Esophageal stricture   . GERD (gastroesophageal reflux disease)   . Hyperlipidemia   . Hypertension   . Insomnia   . Osteoarthritis    knees  . Osteopenia   . Posterior vitreous detachment   . Tubular adenoma polyp of rectum   . Vitamin B12 deficiency   . Vitamin D insufficiency     Surgeries: Procedure(s): Right TOTAL KNEE ARTHROPLASTY on 08/06/2018   Discharged Condition: Improved  Hospital Course: MAUDENE STOTLER is an 77 y.o. female who was admitted 08/06/2018 for operative treatment ofPrimary osteoarthritis of right knee. Patient has severe unremitting pain that affects sleep, daily activities, and work/hobbies. After pre-op clearance the patient was taken to the operating room on 08/06/2018 and underwent  Procedure(s): Right TOTAL KNEE ARTHROPLASTY.    Patient was given perioperative antibiotics:  Anti-infectives (From admission, onward)   Start     Dose/Rate Route Frequency Ordered Stop   08/06/18 2200  ceFAZolin (ANCEF) IVPB 2g/100 mL premix     2 g 200 mL/hr over 30 Minutes Intravenous Every 6 hours 08/06/18 2131 08/07/18 0359   08/06/18 1200  ceFAZolin (ANCEF) IVPB 2g/100 mL premix     2 g 200 mL/hr over 30 Minutes Intravenous On call to O.R. 08/06/18 1145 08/06/18 1436   08/06/18 1148  ceFAZolin (ANCEF) 2-4 GM/100ML-% IVPB    Note to Pharmacy:  Merryl Hacker   : cabinet override      08/06/18 1148 08/06/18 1426       Patient was given sequential compression devices, early ambulation, and chemoprophylaxis to prevent DVT.  Patient benefited maximally  from hospital stay and there were no complications.    Recent vital signs:  Patient Vitals for the past 24 hrs:  BP Temp Temp src Pulse Resp SpO2 Height Weight  08/06/18 2115 133/84 98.1 F (36.7 C) Oral 74 20 96 % 5\' 7"  (1.702 m) 82.1 kg  08/06/18 2020 138/90 (!) 97.2 F (36.2 C) - 85 (!) 28 93 % - -  08/06/18 1810 125/90 - - 68 (!) 21 96 % - -  08/06/18 1625 - (!) 97.5 F (36.4 C) - - - - - -  08/06/18 1355 - - - 72 14 98 % - -  08/06/18 1350 115/73 - - 71 14 97 % - -  08/06/18 1345 - - - 79 11 98 % - -  08/06/18 1340 - - - 78 18 98 % - -  08/06/18 1157 (!) 165/87 98.7 F (37.1 C) Oral 80 20 96 % - -     Recent laboratory studies:  Recent Labs    08/07/18 0357  WBC 8.8  HGB 12.2  HCT 36.8  PLT 199     Discharge Medications:   Allergies as of 08/07/2018      Reactions   Hydrocodone Other (See Comments)   Causes patient to get the shakes      Medication List    TAKE these medications   alendronate 70 MG tablet Commonly known as:  FOSAMAX Take 70 mg by  mouth every 14 (fourteen) days. Saturdays. Take with a full glass of water on an empty stomach.   ALPRAZolam 0.5 MG tablet Commonly known as:  XANAX Take 0.5 mg by mouth at bedtime.   aspirin EC 325 MG tablet Take 1 tablet (325 mg total) by mouth 2 (two) times daily after a meal. Take x 1 month post op to decrease risk of blood clots.   docusate sodium 100 MG capsule Commonly known as:  COLACE Take 1 capsule (100 mg total) by mouth 2 (two) times daily.   Fish Oil 1200 MG Caps Take 1,200 mg by mouth daily.   lisinopril 40 MG tablet Commonly known as:  PRINIVIL,ZESTRIL Take 40 mg by mouth daily.   loratadine 10 MG tablet Commonly known as:  CLARITIN Take 10 mg by mouth daily.   oxyCODONE-acetaminophen 5-325 MG tablet Commonly known as:  PERCOCET/ROXICET Take 1-2 tablets by mouth every 6 (six) hours as needed for severe pain.   PEPCID COMPLETE 10-800-165 MG chewable tablet Generic drug:   famotidine-calcium carbonate-magnesium hydroxide Chew 1 tablet by mouth 2 (two) times daily as needed (acid reflux/indigestion.).   simvastatin 20 MG tablet Commonly known as:  ZOCOR Take 20 mg by mouth daily with supper.   SYSTANE BALANCE 0.6 % Soln Generic drug:  Propylene Glycol Place 1 drop into both eyes 2 (two) times daily.   tiZANidine 2 MG tablet Commonly known as:  ZANAFLEX Take 1 tablet (2 mg total) by mouth every 8 (eight) hours as needed for muscle spasms.   vitamin B-12 1000 MCG tablet Commonly known as:  CYANOCOBALAMIN Take 1,000 mcg by mouth daily.   Vitamin D3 125 MCG (5000 UT) Caps Take 5,000 Units by mouth every evening.            Discharge Care Instructions  (From admission, onward)         Start     Ordered   08/07/18 0000  Weight bearing as tolerated    Question Answer Comment  Laterality right   Extremity Lower      08/07/18 0820   08/07/18 0000  Discharge wound care:    Comments:  If you have a hip bandage, keep it clean and dry.  Change your bandage as instructed by your health care providers.  If your bandage has been discontinued, keep your incision clean and dry.  Pat dry after bathing.  DO NOT put lotion or powder on your incision.   08/07/18 0820          Diagnostic Studies: Mm 3d Screen Breast Bilateral  Result Date: 07/13/2018 CLINICAL DATA:  Screening. EXAM: DIGITAL SCREENING BILATERAL MAMMOGRAM WITH TOMO AND CAD COMPARISON:  Previous exam(s). ACR Breast Density Category c: The breast tissue is heterogeneously dense, which may obscure small masses. FINDINGS: There are no findings suspicious for malignancy. Images were processed with CAD. IMPRESSION: No mammographic evidence of malignancy. A result letter of this screening mammogram will be mailed directly to the patient. RECOMMENDATION: Screening mammogram in one year. (Code:[id number]-01Y) BI-RADS CATEGORY  1: Negative. Electronically Signed   By: Dorise Bullion III M.D   On: 07/13/2018  14:26    Disposition: Discharge disposition: 01-Home or Self Care       Discharge Instructions    CPM   Complete by:  As directed    Continuous passive motion machine (CPM):      Use the CPM from 0 to 90 for 8 hours per day.      You may increase  by 5 degrees- per day.  You may break it up into 2 or 3 sessions per day.      Use CPM for 1-2 weeks or until you are told to stop.   Call MD / Call 911   Complete by:  As directed    If you experience chest pain or shortness of breath, CALL 911 and be transported to the hospital emergency room.  If you develope a fever above 101 F, pus (white drainage) or increased drainage or redness at the wound, or calf pain, call your surgeon's office.   Diet general   Complete by:  As directed    Discharge wound care:   Complete by:  As directed    If you have a hip bandage, keep it clean and dry.  Change your bandage as instructed by your health care providers.  If your bandage has been discontinued, keep your incision clean and dry.  Pat dry after bathing.  DO NOT put lotion or powder on your incision.   Do not put a pillow under the knee. Place it under the heel.   Complete by:  As directed    Do not sit on low chairs, stoools or toilet seats, as it may be difficult to get up from low surfaces   Complete by:  As directed    Follow the hip precautions as taught in Physical Therapy   Complete by:  As directed    Increase activity slowly as tolerated   Complete by:  As directed    Weight bearing as tolerated   Complete by:  As directed    Laterality:  right   Extremity:  Lower      Follow-up Information    Dorna Leitz, MD. Go on 08/20/2018.   Specialty:  Orthopedic Surgery Why:  Your appointment has been made for 1000  Contact information: [place] [address] [phone]        [facility], [place]. Go on 08/20/2018.   Why:  You will start Outpatient Physical Therapy after your office visit  with Dr. Berenice Primas. Please go over and do your paperwork after seeing the MD. Contact information: Physical Therapy [place] [address] [phone]        [facility], [facility].   Specialty:  Home Health Services Why:  You will be seen by HHPT for 5 visit prior to starting OPPT. Junie Panning has been requested. Contact information: [place] [address] [phone]            Signed: Erlene Senters 08/07/2018, 8:21 AM

## 2018-08-07 NOTE — Progress Notes (Signed)
Subjective: 1 Day Post-Op Procedure(s) (LRB): TOTAL KNEE ARTHROPLASTY (Right) Patient reports pain as mild.  Taking by mouth and voiding okay.  Up to bedside commode.  Awaiting PT this a.m.  Objective: Vital signs in last 24 hours: Temp:  [97.2 F (36.2 C)-98.7 F (37.1 C)] 98.1 F (36.7 C) (12/02 2115) Pulse Rate:  [68-85] 74 (12/02 2115) Resp:  [11-28] 20 (12/02 2115) BP: (115-165)/(73-90) 133/84 (12/02 2115) SpO2:  [93 %-98 %] 96 % (12/02 2115) Weight:  [82.1 kg] 82.1 kg (12/02 2115)  Intake/Output from previous day: 12/02 0701 - 12/03 0700 In: 3832 [P.O.:120; I.V.:1500] Out: 710 [Urine:700; Blood:10] Intake/Output this shift: No intake/output data recorded.  Recent Labs    08/07/18 0357  HGB 12.2   Recent Labs    08/07/18 0357  WBC 8.8  RBC 4.08  HCT 36.8  PLT 199   No results for input(s): NA, K, CL, CO2, BUN, CREATININE, GLUCOSE, CALCIUM in the last 72 hours. No results for input(s): LABPT, INR in the last 72 hours. Right knee exam: Neurovascular intact Sensation intact distally Intact pulses distally Dorsiflexion/Plantar flexion intact Incision: dressing C/D/I Compartment soft   Patient's anticipated LOS is less than 2 midnights, meeting these requirements:  - Lives within 1 hour of care - Has a competent adult at home to recover with post-op recover - NO history of  - Chronic pain requiring opiods  - Diabetes  - Coronary Artery Disease  - Heart failure  - Heart attack  - Stroke  - DVT/VTE  - Cardiac arrhythmia  - Respiratory Failure/COPD  - Renal failure  - Anemia  - Advanced Liver disease      Assessment/Plan: 1 Day Post-Op Procedure(s) (LRB): TOTAL KNEE ARTHROPLASTY (Right) Plan: Aspirin 325 mg enteric-coated twice daily x1 month for DVT prophylaxis. Weight-bear as tolerated on right. Up with therapy Discharge home with home health Follow-up with Dr. Berenice Primas on 08/20/2018.    Erlene Senters 08/07/2018, 8:17 AM

## 2018-08-07 NOTE — Progress Notes (Signed)
Physical Therapy Treatment Patient Details Name: Stephanie Beasley MRN: [id number] DOB: 1940-12-05 Today's Date: 08/07/2018    History of Present Illness 77yo female who received R TKA on 08/06/18, also note recent history of L TKA 01/05/18. PMH HTN, RCR, L TKA     PT Comments    Patient received in bed, very pleasant and willing to participate in afternoon therapy session, husband present and observed session today. Verbally reviewed and practiced total knee HEP, able to perform bed mobility with min guard for support of R LE today. Applied knee immobilizer with totalA for time management, then performed functional transfers with RW and min guard, cues to take extra time to ensure that she has gained her balance before walking. Gait in room only 10f with RW and min guard this afternoon due to increased focus on stair navigation. Transported her to stair case in recliner for time management, then able to ambulate to staircase with min guard and RW, navigated 13 steps with R rail on ascend/L rail on descent and min guard, Min-Mod cues for correct sequencing on stairs but spouse able to verbalize correct pattern for stairs. She was returned to her room and left up in recliner, family present and all needs met/questions and concerns addressed this afternoon.     Follow Up Recommendations  Follow surgeon's recommendation for DC plan and follow-up therapies     Equipment Recommendations  None recommended by PT(has all necessary DME )    Recommendations for Other Services       Precautions / Restrictions Precautions Precautions: Fall Required Braces or Orthoses: Knee Immobilizer - Right Knee Immobilizer - Right: On when out of bed or walking Restrictions Weight Bearing Restrictions: Yes RLE Weight Bearing: Weight bearing as tolerated    Mobility  Bed Mobility Overal bed mobility: Needs Assistance Bed Mobility: Supine to Sit;Sit to Supine     Supine to sit: Min guard Sit to supine: Min  guard   General bed mobility comments: min guard to support R LE   Transfers Overall transfer level: Needs assistance Equipment used: Rolling walker (2 wheeled) Transfers: Sit to/from Stand Sit to Stand: Min guard         General transfer comment: min guard for safety due to tendency for posterior LOB with transfers, cues to take time to get balance before walking   Ambulation/Gait Ambulation/Gait assistance: Min guard Gait Distance (Feet): 15 Feet Assistive device: Rolling walker (2 wheeled) Gait Pattern/deviations: Step-to pattern;Decreased step length - left;Decreased stance time - right;Decreased dorsiflexion - right;Decreased dorsiflexion - left;Decreased weight shift to right;Narrow base of support Gait velocity: decreased    General Gait Details: in room around bed to chair, cues for sequencing and safety especially on turns    Stairs Stairs: Yes Stairs assistance: Min guard Stair Management: One rail Right;Step to pattern;Forwards(R rail ascending/L rail descending ) Number of Stairs: 13 General stair comments: min guard for safety, Min-Mod VC for correct sequencing on stairs, spouse present and able to teach back stair sequencing correctly    Wheelchair Mobility    Modified Rankin (Stroke Patients Only)       Balance Overall balance assessment: Needs assistance Sitting-balance support: Bilateral upper extremity supported;Feet supported Sitting balance-Leahy Scale: Good     Standing balance support: Bilateral upper extremity supported;During functional activity Standing balance-Leahy Scale: Poor Standing balance comment: reliant on B UE support, MinA for balance without UE support  Cognition Arousal/Alertness: Awake/alert Behavior During Therapy: WFL for tasks assessed/performed Overall Cognitive Status: Within Functional Limits for tasks assessed                                        Exercises       General Comments        Pertinent Vitals/Pain Pain Assessment: No/denies pain    Home Living                      Prior Function            PT Goals (current goals can now be found in the care plan section) Acute Rehab PT Goals Patient Stated Goal: go home, start HHPT  PT Goal Formulation: With patient Time For Goal Achievement: 08/21/18 Potential to Achieve Goals: Good Progress towards PT goals: Progressing toward goals    Frequency    7X/week      PT Plan Current plan remains appropriate    Co-evaluation              AM-PAC PT "6 Clicks" Mobility   Outcome Measure  Help needed turning from your back to your side while in a flat bed without using bedrails?: None Help needed moving from lying on your back to sitting on the side of a flat bed without using bedrails?: None Help needed moving to and from a bed to a chair (including a wheelchair)?: A Little Help needed standing up from a chair using your arms (e.g., wheelchair or bedside chair)?: A Little Help needed to walk in hospital room?: A Little Help needed climbing 3-5 steps with a railing? : A Little 6 Click Score: 20    End of Session Equipment Utilized During Treatment: Gait belt;Right knee immobilizer Activity Tolerance: Patient tolerated treatment well;Patient limited by fatigue Patient left: in chair;with call bell/phone within reach;with family/visitor present Nurse Communication: Mobility status PT Visit Diagnosis: Unsteadiness on feet (R26.81);Difficulty in walking, not elsewhere classified (R26.2);Muscle weakness (generalized) (M62.81)     Time: 1610-9604 PT Time Calculation (min) (ACUTE ONLY): 28 min  Charges:  $Gait Training: 8-22 mins $Therapeutic Exercise: 8-22 mins                     Deniece Ree PT, DPT, CBIS  Supplemental Physical Therapist [place]    Pager [phone] Acute Rehab Office [phone]

## 2018-08-07 NOTE — Plan of Care (Signed)

## 2018-08-08 DIAGNOSIS — I10 Essential (primary) hypertension: Secondary | ICD-10-CM | POA: Diagnosis not present

## 2018-08-08 DIAGNOSIS — E559 Vitamin D deficiency, unspecified: Secondary | ICD-10-CM | POA: Diagnosis not present

## 2018-08-08 DIAGNOSIS — Z96653 Presence of artificial knee joint, bilateral: Secondary | ICD-10-CM | POA: Diagnosis not present

## 2018-08-08 DIAGNOSIS — K219 Gastro-esophageal reflux disease without esophagitis: Secondary | ICD-10-CM | POA: Diagnosis not present

## 2018-08-08 DIAGNOSIS — Z471 Aftercare following joint replacement surgery: Secondary | ICD-10-CM | POA: Diagnosis not present

## 2018-08-08 DIAGNOSIS — F418 Other specified anxiety disorders: Secondary | ICD-10-CM | POA: Diagnosis not present

## 2018-08-08 DIAGNOSIS — E538 Deficiency of other specified B group vitamins: Secondary | ICD-10-CM | POA: Diagnosis not present

## 2018-08-10 DIAGNOSIS — I10 Essential (primary) hypertension: Secondary | ICD-10-CM | POA: Diagnosis not present

## 2018-08-10 DIAGNOSIS — Z471 Aftercare following joint replacement surgery: Secondary | ICD-10-CM | POA: Diagnosis not present

## 2018-08-10 DIAGNOSIS — E538 Deficiency of other specified B group vitamins: Secondary | ICD-10-CM | POA: Diagnosis not present

## 2018-08-10 DIAGNOSIS — K219 Gastro-esophageal reflux disease without esophagitis: Secondary | ICD-10-CM | POA: Diagnosis not present

## 2018-08-10 DIAGNOSIS — E559 Vitamin D deficiency, unspecified: Secondary | ICD-10-CM | POA: Diagnosis not present

## 2018-08-10 DIAGNOSIS — F418 Other specified anxiety disorders: Secondary | ICD-10-CM | POA: Diagnosis not present

## 2018-08-13 DIAGNOSIS — E559 Vitamin D deficiency, unspecified: Secondary | ICD-10-CM | POA: Diagnosis not present

## 2018-08-13 DIAGNOSIS — Z471 Aftercare following joint replacement surgery: Secondary | ICD-10-CM | POA: Diagnosis not present

## 2018-08-13 DIAGNOSIS — E538 Deficiency of other specified B group vitamins: Secondary | ICD-10-CM | POA: Diagnosis not present

## 2018-08-13 DIAGNOSIS — F418 Other specified anxiety disorders: Secondary | ICD-10-CM | POA: Diagnosis not present

## 2018-08-13 DIAGNOSIS — I10 Essential (primary) hypertension: Secondary | ICD-10-CM | POA: Diagnosis not present

## 2018-08-13 DIAGNOSIS — K219 Gastro-esophageal reflux disease without esophagitis: Secondary | ICD-10-CM | POA: Diagnosis not present

## 2018-08-15 DIAGNOSIS — E538 Deficiency of other specified B group vitamins: Secondary | ICD-10-CM | POA: Diagnosis not present

## 2018-08-15 DIAGNOSIS — Z471 Aftercare following joint replacement surgery: Secondary | ICD-10-CM | POA: Diagnosis not present

## 2018-08-15 DIAGNOSIS — E559 Vitamin D deficiency, unspecified: Secondary | ICD-10-CM | POA: Diagnosis not present

## 2018-08-15 DIAGNOSIS — I10 Essential (primary) hypertension: Secondary | ICD-10-CM | POA: Diagnosis not present

## 2018-08-15 DIAGNOSIS — F418 Other specified anxiety disorders: Secondary | ICD-10-CM | POA: Diagnosis not present

## 2018-08-15 DIAGNOSIS — K219 Gastro-esophageal reflux disease without esophagitis: Secondary | ICD-10-CM | POA: Diagnosis not present

## 2018-08-16 DIAGNOSIS — Z471 Aftercare following joint replacement surgery: Secondary | ICD-10-CM | POA: Diagnosis not present

## 2018-08-16 DIAGNOSIS — K219 Gastro-esophageal reflux disease without esophagitis: Secondary | ICD-10-CM | POA: Diagnosis not present

## 2018-08-16 DIAGNOSIS — E559 Vitamin D deficiency, unspecified: Secondary | ICD-10-CM | POA: Diagnosis not present

## 2018-08-16 DIAGNOSIS — E538 Deficiency of other specified B group vitamins: Secondary | ICD-10-CM | POA: Diagnosis not present

## 2018-08-16 DIAGNOSIS — I10 Essential (primary) hypertension: Secondary | ICD-10-CM | POA: Diagnosis not present

## 2018-08-16 DIAGNOSIS — F418 Other specified anxiety disorders: Secondary | ICD-10-CM | POA: Diagnosis not present

## 2018-08-20 DIAGNOSIS — M25561 Pain in right knee: Secondary | ICD-10-CM | POA: Diagnosis not present

## 2018-08-20 DIAGNOSIS — Z96651 Presence of right artificial knee joint: Secondary | ICD-10-CM | POA: Diagnosis not present

## 2018-08-20 DIAGNOSIS — M25661 Stiffness of right knee, not elsewhere classified: Secondary | ICD-10-CM | POA: Diagnosis not present

## 2018-08-20 NOTE — Anesthesia Postprocedure Evaluation (Addendum)
Anesthesia Post Note  Patient: Stephanie Beasley  Procedure(s) Performed: TOTAL KNEE ARTHROPLASTY (Right Knee)     Patient location during evaluation: PACU Anesthesia Type: Spinal Level of consciousness: awake Pain management: pain level controlled Vital Signs Assessment: post-procedure vital signs reviewed and stable Respiratory status: spontaneous breathing Cardiovascular status: stable Postop Assessment: no headache, no backache, spinal receding and no apparent nausea or vomiting Anesthetic complications: no    Last Vitals:  Vitals:   08/07/18 1000 08/07/18 1351  BP: (!) 157/104 93/66  Pulse: 79 (!) 102  Resp: 16 20  Temp:  36.8 C  SpO2: 94% 94%    Last Pain:  Vitals:   08/07/18 1351  TempSrc: Oral  PainSc:    Pain Goal: Patients Stated Pain Goal: 3 (08/07/18 0126)               Huston Foley

## 2018-08-27 DIAGNOSIS — M25661 Stiffness of right knee, not elsewhere classified: Secondary | ICD-10-CM | POA: Diagnosis not present

## 2018-08-27 DIAGNOSIS — Z96651 Presence of right artificial knee joint: Secondary | ICD-10-CM | POA: Diagnosis not present

## 2018-08-30 DIAGNOSIS — M25661 Stiffness of right knee, not elsewhere classified: Secondary | ICD-10-CM | POA: Diagnosis not present

## 2018-08-30 DIAGNOSIS — Z96651 Presence of right artificial knee joint: Secondary | ICD-10-CM | POA: Diagnosis not present

## 2018-09-03 DIAGNOSIS — M25661 Stiffness of right knee, not elsewhere classified: Secondary | ICD-10-CM | POA: Diagnosis not present

## 2018-09-03 DIAGNOSIS — Z96651 Presence of right artificial knee joint: Secondary | ICD-10-CM | POA: Diagnosis not present

## 2018-09-06 DIAGNOSIS — Z96651 Presence of right artificial knee joint: Secondary | ICD-10-CM | POA: Diagnosis not present

## 2018-09-06 DIAGNOSIS — M25661 Stiffness of right knee, not elsewhere classified: Secondary | ICD-10-CM | POA: Diagnosis not present

## 2018-09-10 DIAGNOSIS — E538 Deficiency of other specified B group vitamins: Secondary | ICD-10-CM | POA: Diagnosis not present

## 2018-09-10 DIAGNOSIS — R7309 Other abnormal glucose: Secondary | ICD-10-CM | POA: Diagnosis not present

## 2018-09-10 DIAGNOSIS — E785 Hyperlipidemia, unspecified: Secondary | ICD-10-CM | POA: Diagnosis not present

## 2018-09-10 DIAGNOSIS — M859 Disorder of bone density and structure, unspecified: Secondary | ICD-10-CM | POA: Diagnosis not present

## 2018-09-10 DIAGNOSIS — I10 Essential (primary) hypertension: Secondary | ICD-10-CM | POA: Diagnosis not present

## 2018-09-10 DIAGNOSIS — M25661 Stiffness of right knee, not elsewhere classified: Secondary | ICD-10-CM | POA: Diagnosis not present

## 2018-09-10 DIAGNOSIS — Z96651 Presence of right artificial knee joint: Secondary | ICD-10-CM | POA: Diagnosis not present

## 2018-09-10 DIAGNOSIS — E7849 Other hyperlipidemia: Secondary | ICD-10-CM | POA: Diagnosis not present

## 2018-09-10 DIAGNOSIS — R82998 Other abnormal findings in urine: Secondary | ICD-10-CM | POA: Diagnosis not present

## 2018-09-10 DIAGNOSIS — M8580 Other specified disorders of bone density and structure, unspecified site: Secondary | ICD-10-CM | POA: Diagnosis not present

## 2018-09-10 DIAGNOSIS — Z0000 Encounter for general adult medical examination without abnormal findings: Secondary | ICD-10-CM | POA: Diagnosis not present

## 2018-09-12 DIAGNOSIS — Z96651 Presence of right artificial knee joint: Secondary | ICD-10-CM | POA: Diagnosis not present

## 2018-09-12 DIAGNOSIS — M25661 Stiffness of right knee, not elsewhere classified: Secondary | ICD-10-CM | POA: Diagnosis not present

## 2018-09-17 DIAGNOSIS — M8580 Other specified disorders of bone density and structure, unspecified site: Secondary | ICD-10-CM | POA: Diagnosis not present

## 2018-09-17 DIAGNOSIS — Z6828 Body mass index (BMI) 28.0-28.9, adult: Secondary | ICD-10-CM | POA: Diagnosis not present

## 2018-09-17 DIAGNOSIS — Z1389 Encounter for screening for other disorder: Secondary | ICD-10-CM | POA: Diagnosis not present

## 2018-09-17 DIAGNOSIS — I341 Nonrheumatic mitral (valve) prolapse: Secondary | ICD-10-CM | POA: Diagnosis not present

## 2018-09-17 DIAGNOSIS — M25661 Stiffness of right knee, not elsewhere classified: Secondary | ICD-10-CM | POA: Diagnosis not present

## 2018-09-17 DIAGNOSIS — Z96651 Presence of right artificial knee joint: Secondary | ICD-10-CM | POA: Diagnosis not present

## 2018-09-17 DIAGNOSIS — R030 Elevated blood-pressure reading, without diagnosis of hypertension: Secondary | ICD-10-CM | POA: Diagnosis not present

## 2018-09-17 DIAGNOSIS — M1990 Unspecified osteoarthritis, unspecified site: Secondary | ICD-10-CM | POA: Diagnosis not present

## 2018-09-17 DIAGNOSIS — N183 Chronic kidney disease, stage 3 (moderate): Secondary | ICD-10-CM | POA: Diagnosis not present

## 2018-09-17 DIAGNOSIS — R7309 Other abnormal glucose: Secondary | ICD-10-CM | POA: Diagnosis not present

## 2018-09-17 DIAGNOSIS — I10 Essential (primary) hypertension: Secondary | ICD-10-CM | POA: Diagnosis not present

## 2018-09-17 DIAGNOSIS — R350 Frequency of micturition: Secondary | ICD-10-CM | POA: Diagnosis not present

## 2018-09-17 DIAGNOSIS — Z0000 Encounter for general adult medical examination without abnormal findings: Secondary | ICD-10-CM | POA: Diagnosis not present

## 2018-09-17 DIAGNOSIS — E7849 Other hyperlipidemia: Secondary | ICD-10-CM | POA: Diagnosis not present

## 2018-09-18 DIAGNOSIS — Z1212 Encounter for screening for malignant neoplasm of rectum: Secondary | ICD-10-CM | POA: Diagnosis not present

## 2018-09-20 DIAGNOSIS — M25661 Stiffness of right knee, not elsewhere classified: Secondary | ICD-10-CM | POA: Diagnosis not present

## 2018-09-20 DIAGNOSIS — Z96651 Presence of right artificial knee joint: Secondary | ICD-10-CM | POA: Diagnosis not present

## 2018-09-20 DIAGNOSIS — M25561 Pain in right knee: Secondary | ICD-10-CM | POA: Diagnosis not present

## 2018-09-20 DIAGNOSIS — Z9889 Other specified postprocedural states: Secondary | ICD-10-CM | POA: Diagnosis not present

## 2018-09-20 DIAGNOSIS — Z96652 Presence of left artificial knee joint: Secondary | ICD-10-CM | POA: Diagnosis not present

## 2018-09-25 DIAGNOSIS — Z96651 Presence of right artificial knee joint: Secondary | ICD-10-CM | POA: Diagnosis not present

## 2018-09-25 DIAGNOSIS — M25661 Stiffness of right knee, not elsewhere classified: Secondary | ICD-10-CM | POA: Diagnosis not present

## 2018-09-27 DIAGNOSIS — M25661 Stiffness of right knee, not elsewhere classified: Secondary | ICD-10-CM | POA: Diagnosis not present

## 2018-09-27 DIAGNOSIS — Z96651 Presence of right artificial knee joint: Secondary | ICD-10-CM | POA: Diagnosis not present

## 2018-11-06 DIAGNOSIS — M25561 Pain in right knee: Secondary | ICD-10-CM | POA: Diagnosis not present

## 2018-11-12 DIAGNOSIS — M859 Disorder of bone density and structure, unspecified: Secondary | ICD-10-CM | POA: Diagnosis not present

## 2018-11-12 DIAGNOSIS — M8589 Other specified disorders of bone density and structure, multiple sites: Secondary | ICD-10-CM | POA: Diagnosis not present

## 2018-12-03 DIAGNOSIS — M25561 Pain in right knee: Secondary | ICD-10-CM | POA: Diagnosis not present

## 2018-12-03 DIAGNOSIS — M25562 Pain in left knee: Secondary | ICD-10-CM | POA: Diagnosis not present

## 2019-05-14 DIAGNOSIS — H43813 Vitreous degeneration, bilateral: Secondary | ICD-10-CM | POA: Diagnosis not present

## 2019-05-14 DIAGNOSIS — H524 Presbyopia: Secondary | ICD-10-CM | POA: Diagnosis not present

## 2019-05-17 DIAGNOSIS — Z23 Encounter for immunization: Secondary | ICD-10-CM | POA: Diagnosis not present

## 2019-05-21 DIAGNOSIS — M9262 Juvenile osteochondrosis of tarsus, left ankle: Secondary | ICD-10-CM | POA: Diagnosis not present

## 2019-05-21 DIAGNOSIS — M25561 Pain in right knee: Secondary | ICD-10-CM | POA: Diagnosis not present

## 2019-05-21 DIAGNOSIS — M7662 Achilles tendinitis, left leg: Secondary | ICD-10-CM | POA: Diagnosis not present

## 2019-06-11 ENCOUNTER — Other Ambulatory Visit: Payer: Self-pay | Admitting: Internal Medicine

## 2019-06-11 DIAGNOSIS — Z1231 Encounter for screening mammogram for malignant neoplasm of breast: Secondary | ICD-10-CM

## 2019-07-17 ENCOUNTER — Ambulatory Visit (INDEPENDENT_AMBULATORY_CARE_PROVIDER_SITE_OTHER): Payer: Medicare Other

## 2019-07-17 ENCOUNTER — Other Ambulatory Visit: Payer: Self-pay

## 2019-07-17 DIAGNOSIS — Z1231 Encounter for screening mammogram for malignant neoplasm of breast: Secondary | ICD-10-CM

## 2019-07-25 DIAGNOSIS — M9262 Juvenile osteochondrosis of tarsus, left ankle: Secondary | ICD-10-CM | POA: Diagnosis not present

## 2019-07-25 DIAGNOSIS — M6528 Calcific tendinitis, other site: Secondary | ICD-10-CM | POA: Diagnosis not present

## 2019-08-05 DIAGNOSIS — M7662 Achilles tendinitis, left leg: Secondary | ICD-10-CM | POA: Diagnosis not present

## 2019-09-16 DIAGNOSIS — R739 Hyperglycemia, unspecified: Secondary | ICD-10-CM | POA: Diagnosis not present

## 2019-09-16 DIAGNOSIS — E7849 Other hyperlipidemia: Secondary | ICD-10-CM | POA: Diagnosis not present

## 2019-09-16 DIAGNOSIS — M859 Disorder of bone density and structure, unspecified: Secondary | ICD-10-CM | POA: Diagnosis not present

## 2019-09-17 DIAGNOSIS — I10 Essential (primary) hypertension: Secondary | ICD-10-CM | POA: Diagnosis not present

## 2019-09-17 DIAGNOSIS — Z1212 Encounter for screening for malignant neoplasm of rectum: Secondary | ICD-10-CM | POA: Diagnosis not present

## 2019-09-17 DIAGNOSIS — R82998 Other abnormal findings in urine: Secondary | ICD-10-CM | POA: Diagnosis not present

## 2019-09-23 DIAGNOSIS — Z1331 Encounter for screening for depression: Secondary | ICD-10-CM | POA: Diagnosis not present

## 2019-09-25 DIAGNOSIS — M7662 Achilles tendinitis, left leg: Secondary | ICD-10-CM | POA: Diagnosis not present

## 2019-09-25 DIAGNOSIS — M93872 Other specified osteochondropathies, left ankle and foot: Secondary | ICD-10-CM | POA: Diagnosis not present

## 2019-09-25 DIAGNOSIS — M9262 Juvenile osteochondrosis of tarsus, left ankle: Secondary | ICD-10-CM | POA: Diagnosis not present

## 2019-10-07 DIAGNOSIS — M25572 Pain in left ankle and joints of left foot: Secondary | ICD-10-CM | POA: Diagnosis not present

## 2019-11-05 DIAGNOSIS — S86002D Unspecified injury of left Achilles tendon, subsequent encounter: Secondary | ICD-10-CM | POA: Diagnosis not present

## 2019-11-11 DIAGNOSIS — S86002D Unspecified injury of left Achilles tendon, subsequent encounter: Secondary | ICD-10-CM | POA: Diagnosis not present

## 2019-11-14 DIAGNOSIS — S86002D Unspecified injury of left Achilles tendon, subsequent encounter: Secondary | ICD-10-CM | POA: Diagnosis not present

## 2019-11-19 DIAGNOSIS — S86002D Unspecified injury of left Achilles tendon, subsequent encounter: Secondary | ICD-10-CM | POA: Diagnosis not present

## 2019-11-22 DIAGNOSIS — S86002D Unspecified injury of left Achilles tendon, subsequent encounter: Secondary | ICD-10-CM | POA: Diagnosis not present

## 2019-11-25 DIAGNOSIS — S86002D Unspecified injury of left Achilles tendon, subsequent encounter: Secondary | ICD-10-CM | POA: Diagnosis not present

## 2019-11-26 DIAGNOSIS — S86002D Unspecified injury of left Achilles tendon, subsequent encounter: Secondary | ICD-10-CM | POA: Diagnosis not present

## 2019-11-28 DIAGNOSIS — S86002D Unspecified injury of left Achilles tendon, subsequent encounter: Secondary | ICD-10-CM | POA: Diagnosis not present

## 2019-11-29 DIAGNOSIS — Z23 Encounter for immunization: Secondary | ICD-10-CM | POA: Diagnosis not present

## 2019-12-02 DIAGNOSIS — S86002D Unspecified injury of left Achilles tendon, subsequent encounter: Secondary | ICD-10-CM | POA: Diagnosis not present

## 2019-12-04 DIAGNOSIS — S86002D Unspecified injury of left Achilles tendon, subsequent encounter: Secondary | ICD-10-CM | POA: Diagnosis not present

## 2019-12-09 DIAGNOSIS — S86002D Unspecified injury of left Achilles tendon, subsequent encounter: Secondary | ICD-10-CM | POA: Diagnosis not present

## 2019-12-11 DIAGNOSIS — S86002D Unspecified injury of left Achilles tendon, subsequent encounter: Secondary | ICD-10-CM | POA: Diagnosis not present

## 2019-12-18 DIAGNOSIS — S86012A Strain of left Achilles tendon, initial encounter: Secondary | ICD-10-CM | POA: Diagnosis not present

## 2019-12-25 DIAGNOSIS — Z23 Encounter for immunization: Secondary | ICD-10-CM | POA: Diagnosis not present

## 2020-01-02 DIAGNOSIS — M7662 Achilles tendinitis, left leg: Secondary | ICD-10-CM | POA: Diagnosis not present

## 2020-01-02 DIAGNOSIS — M67874 Other specified disorders of tendon, left ankle and foot: Secondary | ICD-10-CM | POA: Diagnosis not present

## 2020-01-15 DIAGNOSIS — S86012D Strain of left Achilles tendon, subsequent encounter: Secondary | ICD-10-CM | POA: Diagnosis not present

## 2020-05-13 DIAGNOSIS — S86012D Strain of left Achilles tendon, subsequent encounter: Secondary | ICD-10-CM | POA: Diagnosis not present

## 2020-05-19 DIAGNOSIS — H524 Presbyopia: Secondary | ICD-10-CM | POA: Diagnosis not present

## 2020-05-19 DIAGNOSIS — H43813 Vitreous degeneration, bilateral: Secondary | ICD-10-CM | POA: Diagnosis not present

## 2020-05-22 DIAGNOSIS — Z23 Encounter for immunization: Secondary | ICD-10-CM | POA: Diagnosis not present

## 2020-06-22 ENCOUNTER — Other Ambulatory Visit: Payer: Self-pay | Admitting: Internal Medicine

## 2020-06-22 DIAGNOSIS — Z1231 Encounter for screening mammogram for malignant neoplasm of breast: Secondary | ICD-10-CM

## 2020-07-21 DIAGNOSIS — Z23 Encounter for immunization: Secondary | ICD-10-CM | POA: Diagnosis not present

## 2020-07-22 ENCOUNTER — Ambulatory Visit: Payer: Medicare Other

## 2020-09-17 DIAGNOSIS — M859 Disorder of bone density and structure, unspecified: Secondary | ICD-10-CM | POA: Diagnosis not present

## 2020-09-17 DIAGNOSIS — E785 Hyperlipidemia, unspecified: Secondary | ICD-10-CM | POA: Diagnosis not present

## 2020-09-17 DIAGNOSIS — R739 Hyperglycemia, unspecified: Secondary | ICD-10-CM | POA: Diagnosis not present

## 2020-09-17 DIAGNOSIS — E538 Deficiency of other specified B group vitamins: Secondary | ICD-10-CM | POA: Diagnosis not present

## 2020-09-29 DIAGNOSIS — R350 Frequency of micturition: Secondary | ICD-10-CM | POA: Diagnosis not present

## 2020-09-29 DIAGNOSIS — F419 Anxiety disorder, unspecified: Secondary | ICD-10-CM | POA: Diagnosis not present

## 2020-09-29 DIAGNOSIS — I341 Nonrheumatic mitral (valve) prolapse: Secondary | ICD-10-CM | POA: Diagnosis not present

## 2020-09-29 DIAGNOSIS — M8580 Other specified disorders of bone density and structure, unspecified site: Secondary | ICD-10-CM | POA: Diagnosis not present

## 2020-09-29 DIAGNOSIS — G4700 Insomnia, unspecified: Secondary | ICD-10-CM | POA: Diagnosis not present

## 2020-09-29 DIAGNOSIS — Z0000 Encounter for general adult medical examination without abnormal findings: Secondary | ICD-10-CM | POA: Diagnosis not present

## 2020-09-29 DIAGNOSIS — R739 Hyperglycemia, unspecified: Secondary | ICD-10-CM | POA: Diagnosis not present

## 2020-09-29 DIAGNOSIS — E785 Hyperlipidemia, unspecified: Secondary | ICD-10-CM | POA: Diagnosis not present

## 2020-09-29 DIAGNOSIS — Z1212 Encounter for screening for malignant neoplasm of rectum: Secondary | ICD-10-CM | POA: Diagnosis not present

## 2020-09-29 DIAGNOSIS — E538 Deficiency of other specified B group vitamins: Secondary | ICD-10-CM | POA: Diagnosis not present

## 2020-09-29 DIAGNOSIS — N1831 Chronic kidney disease, stage 3a: Secondary | ICD-10-CM | POA: Diagnosis not present

## 2020-09-29 DIAGNOSIS — I10 Essential (primary) hypertension: Secondary | ICD-10-CM | POA: Diagnosis not present

## 2020-09-29 DIAGNOSIS — Z1331 Encounter for screening for depression: Secondary | ICD-10-CM | POA: Diagnosis not present

## 2020-09-29 DIAGNOSIS — R82998 Other abnormal findings in urine: Secondary | ICD-10-CM | POA: Diagnosis not present

## 2020-09-30 ENCOUNTER — Other Ambulatory Visit: Payer: Self-pay

## 2020-09-30 ENCOUNTER — Ambulatory Visit (INDEPENDENT_AMBULATORY_CARE_PROVIDER_SITE_OTHER): Payer: Medicare Other

## 2020-09-30 DIAGNOSIS — Z1231 Encounter for screening mammogram for malignant neoplasm of breast: Secondary | ICD-10-CM | POA: Diagnosis not present

## 2020-11-09 DIAGNOSIS — M76821 Posterior tibial tendinitis, right leg: Secondary | ICD-10-CM | POA: Diagnosis not present

## 2020-12-07 DIAGNOSIS — M76821 Posterior tibial tendinitis, right leg: Secondary | ICD-10-CM | POA: Diagnosis not present

## 2021-04-06 DIAGNOSIS — M9903 Segmental and somatic dysfunction of lumbar region: Secondary | ICD-10-CM | POA: Diagnosis not present

## 2021-04-06 DIAGNOSIS — S338XXA Sprain of other parts of lumbar spine and pelvis, initial encounter: Secondary | ICD-10-CM | POA: Diagnosis not present

## 2021-04-08 DIAGNOSIS — M9903 Segmental and somatic dysfunction of lumbar region: Secondary | ICD-10-CM | POA: Diagnosis not present

## 2021-04-08 DIAGNOSIS — S338XXA Sprain of other parts of lumbar spine and pelvis, initial encounter: Secondary | ICD-10-CM | POA: Diagnosis not present

## 2021-04-12 DIAGNOSIS — S338XXA Sprain of other parts of lumbar spine and pelvis, initial encounter: Secondary | ICD-10-CM | POA: Diagnosis not present

## 2021-04-12 DIAGNOSIS — M9903 Segmental and somatic dysfunction of lumbar region: Secondary | ICD-10-CM | POA: Diagnosis not present

## 2021-04-15 DIAGNOSIS — M9903 Segmental and somatic dysfunction of lumbar region: Secondary | ICD-10-CM | POA: Diagnosis not present

## 2021-04-15 DIAGNOSIS — S338XXA Sprain of other parts of lumbar spine and pelvis, initial encounter: Secondary | ICD-10-CM | POA: Diagnosis not present

## 2021-04-17 DIAGNOSIS — S338XXA Sprain of other parts of lumbar spine and pelvis, initial encounter: Secondary | ICD-10-CM | POA: Diagnosis not present

## 2021-04-17 DIAGNOSIS — M9903 Segmental and somatic dysfunction of lumbar region: Secondary | ICD-10-CM | POA: Diagnosis not present

## 2021-04-19 DIAGNOSIS — M9903 Segmental and somatic dysfunction of lumbar region: Secondary | ICD-10-CM | POA: Diagnosis not present

## 2021-04-19 DIAGNOSIS — S338XXA Sprain of other parts of lumbar spine and pelvis, initial encounter: Secondary | ICD-10-CM | POA: Diagnosis not present

## 2021-04-22 DIAGNOSIS — S338XXA Sprain of other parts of lumbar spine and pelvis, initial encounter: Secondary | ICD-10-CM | POA: Diagnosis not present

## 2021-04-22 DIAGNOSIS — M9903 Segmental and somatic dysfunction of lumbar region: Secondary | ICD-10-CM | POA: Diagnosis not present

## 2021-04-24 DIAGNOSIS — M9903 Segmental and somatic dysfunction of lumbar region: Secondary | ICD-10-CM | POA: Diagnosis not present

## 2021-04-24 DIAGNOSIS — S338XXA Sprain of other parts of lumbar spine and pelvis, initial encounter: Secondary | ICD-10-CM | POA: Diagnosis not present

## 2021-05-06 DIAGNOSIS — S338XXA Sprain of other parts of lumbar spine and pelvis, initial encounter: Secondary | ICD-10-CM | POA: Diagnosis not present

## 2021-05-06 DIAGNOSIS — M9903 Segmental and somatic dysfunction of lumbar region: Secondary | ICD-10-CM | POA: Diagnosis not present

## 2021-05-07 DIAGNOSIS — S338XXA Sprain of other parts of lumbar spine and pelvis, initial encounter: Secondary | ICD-10-CM | POA: Diagnosis not present

## 2021-05-07 DIAGNOSIS — M9903 Segmental and somatic dysfunction of lumbar region: Secondary | ICD-10-CM | POA: Diagnosis not present

## 2021-05-11 DIAGNOSIS — M545 Low back pain, unspecified: Secondary | ICD-10-CM | POA: Diagnosis not present

## 2021-05-11 DIAGNOSIS — M25551 Pain in right hip: Secondary | ICD-10-CM | POA: Diagnosis not present

## 2021-05-11 DIAGNOSIS — M79641 Pain in right hand: Secondary | ICD-10-CM | POA: Diagnosis not present

## 2021-05-20 DIAGNOSIS — H524 Presbyopia: Secondary | ICD-10-CM | POA: Diagnosis not present

## 2021-05-20 DIAGNOSIS — H43813 Vitreous degeneration, bilateral: Secondary | ICD-10-CM | POA: Diagnosis not present

## 2021-05-28 ENCOUNTER — Other Ambulatory Visit (HOSPITAL_COMMUNITY): Payer: Self-pay | Admitting: Internal Medicine

## 2021-05-28 DIAGNOSIS — I10 Essential (primary) hypertension: Secondary | ICD-10-CM | POA: Diagnosis not present

## 2021-05-28 DIAGNOSIS — K219 Gastro-esophageal reflux disease without esophagitis: Secondary | ICD-10-CM | POA: Diagnosis not present

## 2021-05-28 DIAGNOSIS — I341 Nonrheumatic mitral (valve) prolapse: Secondary | ICD-10-CM | POA: Diagnosis not present

## 2021-05-28 DIAGNOSIS — N1831 Chronic kidney disease, stage 3a: Secondary | ICD-10-CM | POA: Diagnosis not present

## 2021-05-28 DIAGNOSIS — R0989 Other specified symptoms and signs involving the circulatory and respiratory systems: Secondary | ICD-10-CM | POA: Diagnosis not present

## 2021-05-28 DIAGNOSIS — R739 Hyperglycemia, unspecified: Secondary | ICD-10-CM | POA: Diagnosis not present

## 2021-05-28 DIAGNOSIS — M1990 Unspecified osteoarthritis, unspecified site: Secondary | ICD-10-CM | POA: Diagnosis not present

## 2021-05-28 DIAGNOSIS — R5383 Other fatigue: Secondary | ICD-10-CM | POA: Diagnosis not present

## 2021-05-28 DIAGNOSIS — E538 Deficiency of other specified B group vitamins: Secondary | ICD-10-CM | POA: Diagnosis not present

## 2021-05-28 DIAGNOSIS — N390 Urinary tract infection, site not specified: Secondary | ICD-10-CM | POA: Diagnosis not present

## 2021-05-28 DIAGNOSIS — R630 Anorexia: Secondary | ICD-10-CM | POA: Diagnosis not present

## 2021-06-03 ENCOUNTER — Other Ambulatory Visit (HOSPITAL_COMMUNITY): Payer: Self-pay | Admitting: Internal Medicine

## 2021-06-03 ENCOUNTER — Other Ambulatory Visit: Payer: Self-pay | Admitting: Internal Medicine

## 2021-06-03 DIAGNOSIS — M25551 Pain in right hip: Secondary | ICD-10-CM | POA: Diagnosis not present

## 2021-06-03 DIAGNOSIS — R110 Nausea: Secondary | ICD-10-CM

## 2021-06-03 DIAGNOSIS — R634 Abnormal weight loss: Secondary | ICD-10-CM

## 2021-06-03 DIAGNOSIS — M545 Low back pain, unspecified: Secondary | ICD-10-CM | POA: Diagnosis not present

## 2021-06-04 ENCOUNTER — Other Ambulatory Visit: Payer: Self-pay

## 2021-06-04 ENCOUNTER — Ambulatory Visit (HOSPITAL_COMMUNITY)
Admission: RE | Admit: 2021-06-04 | Discharge: 2021-06-04 | Disposition: A | Discharge status: Home / Self Care | Payer: Medicare Other | Source: Ambulatory Visit | Attending: Internal Medicine | Admitting: Internal Medicine

## 2021-06-04 ENCOUNTER — Encounter (HOSPITAL_COMMUNITY): Payer: Self-pay

## 2021-06-04 DIAGNOSIS — R110 Nausea: Secondary | ICD-10-CM | POA: Diagnosis not present

## 2021-06-04 DIAGNOSIS — M25551 Pain in right hip: Secondary | ICD-10-CM | POA: Diagnosis not present

## 2021-06-04 DIAGNOSIS — R634 Abnormal weight loss: Secondary | ICD-10-CM | POA: Diagnosis not present

## 2021-06-04 DIAGNOSIS — M545 Low back pain, unspecified: Secondary | ICD-10-CM | POA: Diagnosis not present

## 2021-06-04 DIAGNOSIS — R630 Anorexia: Secondary | ICD-10-CM | POA: Diagnosis not present

## 2021-06-04 MED ORDER — IOHEXOL 350 MG/ML SOLN
75.0000 mL | Freq: Once | INTRAVENOUS | Status: AC | PRN
  Administered 2021-06-04: 75 mL via INTRAVENOUS

## 2021-06-07 ENCOUNTER — Encounter: Payer: Self-pay | Admitting: Internal Medicine

## 2021-06-08 DIAGNOSIS — R5383 Other fatigue: Secondary | ICD-10-CM | POA: Diagnosis not present

## 2021-06-08 DIAGNOSIS — M25532 Pain in left wrist: Secondary | ICD-10-CM | POA: Diagnosis not present

## 2021-06-08 DIAGNOSIS — R110 Nausea: Secondary | ICD-10-CM | POA: Diagnosis not present

## 2021-06-08 DIAGNOSIS — R630 Anorexia: Secondary | ICD-10-CM | POA: Diagnosis not present

## 2021-06-08 DIAGNOSIS — R509 Fever, unspecified: Secondary | ICD-10-CM | POA: Diagnosis not present

## 2021-06-08 DIAGNOSIS — N1831 Chronic kidney disease, stage 3a: Secondary | ICD-10-CM | POA: Diagnosis not present

## 2021-06-08 DIAGNOSIS — M25511 Pain in right shoulder: Secondary | ICD-10-CM | POA: Diagnosis not present

## 2021-06-08 DIAGNOSIS — M25512 Pain in left shoulder: Secondary | ICD-10-CM | POA: Diagnosis not present

## 2021-06-08 DIAGNOSIS — M25552 Pain in left hip: Secondary | ICD-10-CM | POA: Diagnosis not present

## 2021-06-08 DIAGNOSIS — M25531 Pain in right wrist: Secondary | ICD-10-CM | POA: Diagnosis not present

## 2021-06-08 DIAGNOSIS — R634 Abnormal weight loss: Secondary | ICD-10-CM | POA: Diagnosis not present

## 2021-06-08 DIAGNOSIS — M25551 Pain in right hip: Secondary | ICD-10-CM | POA: Diagnosis not present

## 2021-06-08 DIAGNOSIS — I10 Essential (primary) hypertension: Secondary | ICD-10-CM | POA: Diagnosis not present

## 2021-06-08 DIAGNOSIS — I341 Nonrheumatic mitral (valve) prolapse: Secondary | ICD-10-CM | POA: Diagnosis not present

## 2021-06-09 ENCOUNTER — Other Ambulatory Visit: Payer: Self-pay

## 2021-06-09 ENCOUNTER — Other Ambulatory Visit: Payer: Self-pay | Admitting: Internal Medicine

## 2021-06-09 ENCOUNTER — Ambulatory Visit (HOSPITAL_COMMUNITY)
Admission: RE | Admit: 2021-06-09 | Discharge: 2021-06-09 | Disposition: A | Discharge status: Home / Self Care | Payer: Medicare Other | Source: Ambulatory Visit | Attending: Internal Medicine | Admitting: Internal Medicine

## 2021-06-09 DIAGNOSIS — R0989 Other specified symptoms and signs involving the circulatory and respiratory systems: Secondary | ICD-10-CM | POA: Diagnosis not present

## 2021-06-09 DIAGNOSIS — R634 Abnormal weight loss: Secondary | ICD-10-CM

## 2021-06-09 DIAGNOSIS — R110 Nausea: Secondary | ICD-10-CM

## 2021-06-10 DIAGNOSIS — M1611 Unilateral primary osteoarthritis, right hip: Secondary | ICD-10-CM | POA: Diagnosis not present

## 2021-06-10 DIAGNOSIS — M545 Low back pain, unspecified: Secondary | ICD-10-CM | POA: Diagnosis not present

## 2021-06-10 DIAGNOSIS — M1812 Unilateral primary osteoarthritis of first carpometacarpal joint, left hand: Secondary | ICD-10-CM | POA: Diagnosis not present

## 2021-06-11 ENCOUNTER — Ambulatory Visit
Admission: RE | Admit: 2021-06-11 | Discharge: 2021-06-11 | Disposition: A | Discharge status: Home / Self Care | Payer: Medicare Other | Source: Ambulatory Visit | Attending: Internal Medicine | Admitting: Internal Medicine

## 2021-06-11 ENCOUNTER — Other Ambulatory Visit: Payer: Self-pay | Admitting: Internal Medicine

## 2021-06-11 DIAGNOSIS — K224 Dyskinesia of esophagus: Secondary | ICD-10-CM | POA: Diagnosis not present

## 2021-06-11 DIAGNOSIS — K449 Diaphragmatic hernia without obstruction or gangrene: Secondary | ICD-10-CM | POA: Diagnosis not present

## 2021-06-11 DIAGNOSIS — R634 Abnormal weight loss: Secondary | ICD-10-CM

## 2021-06-11 DIAGNOSIS — R110 Nausea: Secondary | ICD-10-CM | POA: Diagnosis not present

## 2021-06-11 DIAGNOSIS — K219 Gastro-esophageal reflux disease without esophagitis: Secondary | ICD-10-CM | POA: Diagnosis not present

## 2021-06-16 ENCOUNTER — Other Ambulatory Visit: Payer: Self-pay | Admitting: Internal Medicine

## 2021-06-16 DIAGNOSIS — E041 Nontoxic single thyroid nodule: Secondary | ICD-10-CM

## 2021-06-22 DIAGNOSIS — Z7952 Long term (current) use of systemic steroids: Secondary | ICD-10-CM | POA: Diagnosis not present

## 2021-06-22 DIAGNOSIS — R5383 Other fatigue: Secondary | ICD-10-CM | POA: Diagnosis not present

## 2021-06-22 DIAGNOSIS — E041 Nontoxic single thyroid nodule: Secondary | ICD-10-CM | POA: Diagnosis not present

## 2021-06-22 DIAGNOSIS — M353 Polymyalgia rheumatica: Secondary | ICD-10-CM | POA: Diagnosis not present

## 2021-06-22 DIAGNOSIS — I341 Nonrheumatic mitral (valve) prolapse: Secondary | ICD-10-CM | POA: Diagnosis not present

## 2021-06-22 DIAGNOSIS — N1831 Chronic kidney disease, stage 3a: Secondary | ICD-10-CM | POA: Diagnosis not present

## 2021-06-22 DIAGNOSIS — R630 Anorexia: Secondary | ICD-10-CM | POA: Diagnosis not present

## 2021-06-22 DIAGNOSIS — M1990 Unspecified osteoarthritis, unspecified site: Secondary | ICD-10-CM | POA: Diagnosis not present

## 2021-06-22 DIAGNOSIS — R110 Nausea: Secondary | ICD-10-CM | POA: Diagnosis not present

## 2021-06-22 DIAGNOSIS — I10 Essential (primary) hypertension: Secondary | ICD-10-CM | POA: Diagnosis not present

## 2021-06-24 ENCOUNTER — Ambulatory Visit
Admission: RE | Admit: 2021-06-24 | Discharge: 2021-06-24 | Disposition: A | Discharge status: Home / Self Care | Payer: Medicare Other | Source: Ambulatory Visit | Attending: Internal Medicine | Admitting: Internal Medicine

## 2021-06-24 DIAGNOSIS — E041 Nontoxic single thyroid nodule: Secondary | ICD-10-CM | POA: Diagnosis not present

## 2021-06-29 ENCOUNTER — Other Ambulatory Visit: Payer: Self-pay | Admitting: Internal Medicine

## 2021-06-29 DIAGNOSIS — E041 Nontoxic single thyroid nodule: Secondary | ICD-10-CM

## 2021-06-30 DIAGNOSIS — B370 Candidal stomatitis: Secondary | ICD-10-CM | POA: Diagnosis not present

## 2021-06-30 DIAGNOSIS — E663 Overweight: Secondary | ICD-10-CM | POA: Diagnosis not present

## 2021-06-30 DIAGNOSIS — M3500 Sicca syndrome, unspecified: Secondary | ICD-10-CM | POA: Diagnosis not present

## 2021-06-30 DIAGNOSIS — M353 Polymyalgia rheumatica: Secondary | ICD-10-CM | POA: Diagnosis not present

## 2021-06-30 DIAGNOSIS — M159 Polyosteoarthritis, unspecified: Secondary | ICD-10-CM | POA: Diagnosis not present

## 2021-06-30 DIAGNOSIS — Z6826 Body mass index (BMI) 26.0-26.9, adult: Secondary | ICD-10-CM | POA: Diagnosis not present

## 2021-06-30 DIAGNOSIS — R5383 Other fatigue: Secondary | ICD-10-CM | POA: Diagnosis not present

## 2021-07-06 ENCOUNTER — Other Ambulatory Visit (HOSPITAL_COMMUNITY)
Admission: RE | Admit: 2021-07-06 | Discharge: 2021-07-06 | Disposition: A | Discharge status: Home / Self Care | Payer: Medicare Other | Source: Ambulatory Visit | Attending: Radiology | Admitting: Radiology

## 2021-07-06 ENCOUNTER — Ambulatory Visit
Admission: RE | Admit: 2021-07-06 | Discharge: 2021-07-06 | Disposition: A | Discharge status: Home / Self Care | Payer: Medicare Other | Source: Ambulatory Visit | Attending: Internal Medicine | Admitting: Internal Medicine

## 2021-07-06 DIAGNOSIS — E079 Disorder of thyroid, unspecified: Secondary | ICD-10-CM | POA: Diagnosis not present

## 2021-07-06 DIAGNOSIS — D440 Neoplasm of uncertain behavior of thyroid gland: Secondary | ICD-10-CM | POA: Insufficient documentation

## 2021-07-06 DIAGNOSIS — E041 Nontoxic single thyroid nodule: Secondary | ICD-10-CM | POA: Diagnosis not present

## 2021-07-07 LAB — CYTOLOGY - NON PAP

## 2021-07-09 ENCOUNTER — Encounter: Payer: Self-pay | Admitting: Internal Medicine

## 2021-07-09 ENCOUNTER — Ambulatory Visit (INDEPENDENT_AMBULATORY_CARE_PROVIDER_SITE_OTHER): Payer: Medicare Other | Admitting: Internal Medicine

## 2021-07-09 VITALS — BP 100/60 | HR 77 | Ht 67.0 in | Wt 171.0 lb

## 2021-07-09 DIAGNOSIS — R634 Abnormal weight loss: Secondary | ICD-10-CM | POA: Diagnosis not present

## 2021-07-09 DIAGNOSIS — Z86010 Personal history of colonic polyps: Secondary | ICD-10-CM

## 2021-07-09 DIAGNOSIS — R0789 Other chest pain: Secondary | ICD-10-CM

## 2021-07-09 NOTE — Progress Notes (Signed)
HISTORY OF PRESENT ILLNESS:  Stephanie Beasley is a 80 y.o. female with past medical history as listed below who presents today regarding unexplained weight loss.  She is sent here by her primary care provider.  I have seen the patient in the past for GERD and constipation.  She does have a history of adenomatous colon polyps and last underwent complete colonoscopy December 2018.  1 diminutive polyp.  Diverticulosis.  Otherwise normal.  No routine follow-up recommended.  Patient tells me that her current history dates back to August when she developed severe myalgias and arthralgias.  As well low-grade temperature.  This was associated with significant decrease in appetite.  Her symptoms persisted.  Her symptoms worsen.  At 1 point she required a wheelchair to assist with ambulation.  She was treated for UTI.  She was treated for tick bite.  Neither helped.  Over that timeframe she lost about 12 pounds.  She was seen on several occasions at her PCPs office.  Laboratories were unremarkable.  A contrast-enhanced CT scan of the abdomen and pelvis was performed June 04, 2021.  This was unremarkable.  Nothing to explain weight loss.  She was then set up for an upper GI series which was performed June 11, 2021.  This revealed a small hiatal hernia but was otherwise normal.  Patient tells me that about a month ago she was diagnosed with polymyalgia rheumatica.  She was placed on steroids.  She began at 40 mg daily.  She is now on 15 mg of prednisone.  She tells me that her rheumatologic complaints have dramatically improved.  She also tells me that her appetite is resumed.  She has had no further weight loss.  Aside from occasional constipation, which is not new, her GI review of systems is negative.  She does mention occasional problems with chest discomfort at night for which she will drink water.  There is prompt resolution of the symptom.  She is no longer on medicine for GERD.  She denies classic GERD symptoms  with any frequency  REVIEW OF SYSTEMS:  All non-GI ROS negative unless otherwise stated in the HPI. Past Medical History:  Diagnosis Date   Anal fissure    Cataract    surgery to remove in 2016 -bilateral   Depression with anxiety    Diverticulitis of colon    ? 2011   Esophageal stricture    GERD (gastroesophageal reflux disease)    Hyperlipidemia    Hypertension    Insomnia    Osteoarthritis    knees   Osteopenia    Posterior vitreous detachment    Tubular adenoma polyp of rectum    Vitamin B12 deficiency    Vitamin D insufficiency     Past Surgical History:  Procedure Laterality Date   APPENDECTOMY     BREAST CYST ASPIRATION     COLONOSCOPY  02/2011    polyps/tics/hems   eye surgery Bilateral 01/2015   remove cataracts   ROTATOR CUFF REPAIR Bilateral    x 2   TOTAL ABDOMINAL HYSTERECTOMY W/ BILATERAL SALPINGOOPHORECTOMY     TOTAL KNEE ARTHROPLASTY Left 01/05/2018   Procedure: LEFT TOTAL KNEE ARTHROPLASTY;  Surgeon: Dorna Leitz, MD;  Location: [facility];  Service: Orthopedics;  Laterality: Left;   TOTAL KNEE ARTHROPLASTY Right 08/06/2018   Procedure: TOTAL KNEE ARTHROPLASTY;  Surgeon: Dorna Leitz, MD;  Location: [facility];  Service: Orthopedics;  Laterality: Right;   [place] EXTRACTION      Social History Stephanie  R Beasley  reports that she has never smoked. She has never used smokeless tobacco. She reports that she does not drink alcohol and does not use drugs.  family history includes Breast cancer in an other family member; COPD in her mother; Diabetes type II in an other family member; Gout in an other family member; Stroke in an other family member.  Allergies  Allergen Reactions   Hydrocodone Other (See Comments)    Causes patient to get the shakes       PHYSICAL EXAMINATION: Vital signs: BP 100/60   Pulse 77   Ht 5\' 7"  (1.702 m)   Wt 171 lb (77.6 kg)   BMI 26.78 kg/m   Constitutional: generally well-appearing, no acute distress Psychiatric: alert  and oriented x3, cooperative Eyes: extraocular movements intact, anicteric, conjunctiva pink Mouth: oral pharynx moist, no lesions Neck: supple no lymphadenopathy Cardiovascular: heart regular rate and rhythm, no murmur Lungs: clear to auscultation bilaterally Abdomen: soft, nontender, nondistended, no obvious ascites, no peritoneal signs, normal bowel sounds, no organomegaly Rectal: Omitted Extremities: no clubbing, cyanosis, or lower extremity edema bilaterally Skin: no lesions on visible extremities Neuro: No focal deficits.  Cranial nerves intact  ASSESSMENT:  1.  Weight loss secondary to decreased appetite secondary to polymyalgia rheumatica.  Improved after diagnosis and treatment 2.  Negative GI work-up for etiologies for weight loss including CT scan, laboratories, upper GI series, and prior colonoscopy. 3.  Occasional short-lived chest pain resolved with consumption of water.  Possible reflux related.   PLAN:  1.  Reassurance 2.  Return to the care of PCP 3.  GI follow-up as needed  A total time of 45 minutes was spent preparing to see the patient, reviewing outside records, laboratory test, and x-rays.  Obtaining comprehensive history, performing medically appropriate physical examination, counseling the patient regarding the above listed issues, and documenting clinical information in the health record

## 2021-07-09 NOTE — Patient Instructions (Signed)
If you are age 80 or older, your body mass index should be between 23-30. Your Body mass index is 26.78 kg/m. If this is out of the aforementioned range listed, please consider follow up with your Primary Care Provider.  If you are age 26 or younger, your body mass index should be between 19-25. Your Body mass index is 26.78 kg/m. If this is out of the aformentioned range listed, please consider follow up with your Primary Care Provider.   ________________________________________________________  The [facility] providers would like to encourage you to use [facility] to communicate with providers for non-urgent requests or questions.  Due to long hold times on the telephone, sending your provider a message by [facility] may be a faster and more efficient way to get a response.  Please allow 48 business hours for a response.  Please remember that this is for non-urgent requests.  _______________________________________________________  Please follow up as needed

## 2021-07-22 DIAGNOSIS — M159 Polyosteoarthritis, unspecified: Secondary | ICD-10-CM | POA: Diagnosis not present

## 2021-07-22 DIAGNOSIS — Z1589 Genetic susceptibility to other disease: Secondary | ICD-10-CM | POA: Diagnosis not present

## 2021-07-22 DIAGNOSIS — M3500 Sicca syndrome, unspecified: Secondary | ICD-10-CM | POA: Diagnosis not present

## 2021-07-22 DIAGNOSIS — Z6827 Body mass index (BMI) 27.0-27.9, adult: Secondary | ICD-10-CM | POA: Diagnosis not present

## 2021-07-22 DIAGNOSIS — M353 Polymyalgia rheumatica: Secondary | ICD-10-CM | POA: Diagnosis not present

## 2021-07-22 DIAGNOSIS — E663 Overweight: Secondary | ICD-10-CM | POA: Diagnosis not present

## 2021-08-18 ENCOUNTER — Ambulatory Visit: Payer: Self-pay | Admitting: Surgery

## 2021-08-18 DIAGNOSIS — D440 Neoplasm of uncertain behavior of thyroid gland: Secondary | ICD-10-CM | POA: Diagnosis not present

## 2021-08-18 DIAGNOSIS — E041 Nontoxic single thyroid nodule: Secondary | ICD-10-CM | POA: Diagnosis not present

## 2021-08-27 NOTE — Patient Instructions (Signed)
DUE TO COVID-19 ONLY ONE VISITOR IS ALLOWED TO COME WITH YOU AND STAY IN THE WAITING ROOM ONLY DURING PRE OP AND PROCEDURE.   **NO VISITORS ARE ALLOWED IN THE SHORT STAY AREA OR RECOVERY ROOM!!**  IF YOU WILL BE ADMITTED INTO THE HOSPITAL YOU ARE ALLOWED ONLY TWO SUPPORT PEOPLE DURING VISITATION HOURS ONLY (7 AM -8PM)   The support person(s) must pass our screening, gel in and out, and wear a mask at all times, including in the patients room. Patients must also wear a mask when staff or their support person are in the room. Visitors GUEST BADGE MUST BE WORN VISIBLY  One adult visitor may remain with you overnight and MUST be in the room by 8 P.M.  No visitors under the age of 48. Any visitor under the age of 38 must be accompanied by an adult.   COVID SWAB TESTING MUST BE COMPLETED ON:  09/08/21 @ 9:45 AM at [facility] in through main entrance. Be sit in the lobby area to the right as you come in the main entrance. Dial [phone] give them your name and let them know you are here for COVID testing.    Your procedure is scheduled on:09/10/21    Report to [facility] Main Entrance    Report to short stay at : 5:15 AM   Call this number if you have problems the morning of surgery [phone]   Do not eat food :After Midnight.   May have liquids until : 4:30 AM   day of surgery  CLEAR LIQUID DIET  Foods Allowed                                                                     Foods Excluded  Water, Black Coffee and tea, regular and decaf                             liquids that you cannot  Plain Jell-O in any flavor  (No red)                                           see through such as: Fruit ices (not with fruit pulp)                                     milk, soups, orange juice              Iced Popsicles (No red)                                    All solid food                                   Apple juices Sports drinks like Gatorade (No red) Lightly  seasoned clear broth or consume(fat free) Sugar  Sample Menu Breakfast  Lunch                                     Supper Cranberry juice                    Beef broth                            Chicken broth Jell-O                                     Grape juice                           Apple juice Coffee or tea                        Jell-O                                      Popsicle                                                Coffee or tea                        Coffee or tea    Oral Hygiene is also important to reduce your risk of infection.                                    Remember - BRUSH YOUR TEETH THE MORNING OF SURGERY WITH YOUR REGULAR TOOTHPASTE   Do NOT smoke after Midnight   Take these medicines the morning of surgery with A SIP OF WATER: loratadine,prednisone.  DO NOT TAKE ANY ORAL DIABETIC MEDICATIONS DAY OF YOUR SURGERY                              You may not have any metal on your body including hair pins, jewelry, and body piercing             Do not wear make-up, lotions, powders, perfumes/cologne, or deodorant  Do not wear nail polish including gel and S&S, artificial/acrylic nails, or any other type of covering on natural nails including finger and toenails. If you have artificial nails, gel coating, etc. that needs to be removed by a nail salon please have this removed prior to surgery or surgery may need to be canceled/ delayed if the surgeon/ anesthesia feels like they are unable to be safely monitored.   Do not shave  48 hours prior to surgery.    Do not bring valuables to the hospital. [place].   Contacts, dentures or bridgework may not be worn into surgery.   Bring small overnight bag day of surgery.    Patients discharged on the  day of surgery will not be allowed to drive home.   Special Instructions: Bring a copy of your healthcare power of attorney and living will  documents         the day of surgery if you haven't scanned them before.              Please read over the following fact sheets you were given: IF YOU HAVE QUESTIONS ABOUT YOUR PRE-OP INSTRUCTIONS PLEASE CALL [phone]     [facility] - Preparing for Surgery Before surgery, you can play an important role.  Because skin is not sterile, your skin needs to be as free of germs as possible.  You can reduce the number of germs on your skin by washing with CHG (chlorahexidine gluconate) soap before surgery.  CHG is an antiseptic cleaner which kills germs and bonds with the skin to continue killing germs even after washing. Please DO NOT use if you have an allergy to CHG or antibacterial soaps.  If your skin becomes reddened/irritated stop using the CHG and inform your nurse when you arrive at Short Stay. Do not shave (including legs and underarms) for at least 48 hours prior to the first CHG shower.  You may shave your face/neck. Please follow these instructions carefully:  1.  Shower with CHG Soap the night before surgery and the  morning of Surgery.  2.  If you choose to wash your hair, wash your hair first as usual with your  normal  shampoo.  3.  After you shampoo, rinse your hair and body thoroughly to remove the  shampoo.                           4.  Use CHG as you would any other liquid soap.  You can apply chg directly  to the skin and wash                       Gently with a scrungie or clean washcloth.  5.  Apply the CHG Soap to your body ONLY FROM THE NECK DOWN.   Do not use on face/ open                           Wound or open sores. Avoid contact with eyes, ears mouth and genitals (private parts).                       Wash face,  Genitals (private parts) with your normal soap.             6.  Wash thoroughly, paying special attention to the area where your surgery  will be performed.  7.  Thoroughly rinse your body with warm water from the neck down.  8.  DO NOT shower/wash with your  normal soap after using and rinsing off  the CHG Soap.                9.  Pat yourself dry with a clean towel.            10.  Wear clean pajamas.            11.  Place clean sheets on your bed the night of your first shower and do not  sleep with pets. Day of Surgery : Do not apply any lotions/deodorants the morning of surgery.  Please wear clean  clothes to the hospital/surgery center.  FAILURE TO FOLLOW THESE INSTRUCTIONS MAY RESULT IN THE CANCELLATION OF YOUR SURGERY PATIENT SIGNATURE_________________________________  NURSE SIGNATURE__________________________________  ________________________________________________________________________

## 2021-08-31 ENCOUNTER — Encounter (HOSPITAL_COMMUNITY)
Admission: RE | Admit: 2021-08-31 | Discharge: 2021-08-31 | Disposition: A | Discharge status: Home / Self Care | Payer: Medicare Other | Source: Ambulatory Visit | Attending: Surgery | Admitting: Surgery

## 2021-08-31 ENCOUNTER — Other Ambulatory Visit: Payer: Self-pay

## 2021-08-31 ENCOUNTER — Encounter (HOSPITAL_COMMUNITY): Payer: Self-pay

## 2021-08-31 ENCOUNTER — Ambulatory Visit (HOSPITAL_COMMUNITY)
Admission: RE | Admit: 2021-08-31 | Discharge: 2021-08-31 | Disposition: A | Discharge status: Home / Self Care | Payer: Medicare Other | Source: Ambulatory Visit | Attending: Anesthesiology | Admitting: Anesthesiology

## 2021-08-31 VITALS — BP 138/78 | HR 86 | Temp 98.6°F | Ht 67.0 in | Wt 179.0 lb

## 2021-08-31 DIAGNOSIS — Z01818 Encounter for other preprocedural examination: Secondary | ICD-10-CM

## 2021-08-31 DIAGNOSIS — I2510 Atherosclerotic heart disease of native coronary artery without angina pectoris: Secondary | ICD-10-CM

## 2021-08-31 DIAGNOSIS — E079 Disorder of thyroid, unspecified: Secondary | ICD-10-CM | POA: Diagnosis not present

## 2021-08-31 LAB — CBC
HCT: 42.5 % (ref 36.0–46.0)
Hemoglobin: 13.3 g/dL (ref 12.0–15.0)
MCH: 28.5 pg (ref 26.0–34.0)
MCHC: 31.3 g/dL (ref 30.0–36.0)
MCV: 91 fL (ref 80.0–100.0)
Platelets: 274 10*3/uL (ref 150–400)
RBC: 4.67 MIL/uL (ref 3.87–5.11)
RDW: 15.1 % (ref 11.5–15.5)
WBC: 7.2 10*3/uL (ref 4.0–10.5)
nRBC: 0 % (ref 0.0–0.2)

## 2021-08-31 LAB — BASIC METABOLIC PANEL
Anion gap: 7 (ref 5–15)
BUN: 23 mg/dL (ref 8–23)
CO2: 25 mmol/L (ref 22–32)
Calcium: 8.8 mg/dL — ABNORMAL LOW (ref 8.9–10.3)
Chloride: 108 mmol/L (ref 98–111)
Creatinine, Ser: 0.85 mg/dL (ref 0.44–1.00)
GFR, Estimated: >60 mL/min (ref >60)
Glucose, Bld: 115 mg/dL — ABNORMAL HIGH (ref 70–99)
Potassium: 4.8 mmol/L (ref 3.5–5.1)
Sodium: 140 mmol/L (ref 135–145)

## 2021-08-31 NOTE — Progress Notes (Signed)
COVID Vaccine Completed: Yes Date COVID Vaccine completed: 2021 x 3 COVID vaccine manufacturer:  Moderna    COVID Test: 09/08/21 PCP - Dr. Shon Baton Cardiologist - N/A  Chest x-ray -  EKG -  Stress Test -  ECHO -  Cardiac Cath -  Pacemaker/ICD device last checked:  Sleep Study -  CPAP -   Fasting Blood Sugar -  Checks Blood Sugar _____ times a day  Blood Thinner Instructions: Aspirin Instructions: Last Dose:  Anesthesia review:   Patient denies shortness of breath, fever, cough and chest pain at PAT appointment   Patient verbalized understanding of instructions that were given to them at the PAT appointment. Patient was also instructed that they will need to review over the PAT instructions again at home before surgery.

## 2021-09-08 ENCOUNTER — Encounter (HOSPITAL_COMMUNITY)
Admission: RE | Admit: 2021-09-08 | Discharge: 2021-09-08 | Disposition: A | Discharge status: Home / Self Care | Payer: Medicare Other | Source: Ambulatory Visit | Attending: Surgery | Admitting: Surgery

## 2021-09-08 ENCOUNTER — Other Ambulatory Visit: Payer: Self-pay

## 2021-09-08 ENCOUNTER — Encounter (HOSPITAL_COMMUNITY): Payer: Self-pay | Admitting: Surgery

## 2021-09-08 DIAGNOSIS — E041 Nontoxic single thyroid nodule: Secondary | ICD-10-CM | POA: Diagnosis present

## 2021-09-08 DIAGNOSIS — Z01812 Encounter for preprocedural laboratory examination: Secondary | ICD-10-CM | POA: Insufficient documentation

## 2021-09-08 DIAGNOSIS — Z20822 Contact with and (suspected) exposure to covid-19: Secondary | ICD-10-CM | POA: Diagnosis not present

## 2021-09-08 DIAGNOSIS — D440 Neoplasm of uncertain behavior of thyroid gland: Secondary | ICD-10-CM | POA: Diagnosis present

## 2021-09-08 DIAGNOSIS — Z01818 Encounter for other preprocedural examination: Secondary | ICD-10-CM

## 2021-09-08 LAB — SARS CORONAVIRUS 2 (TAT 6-24 HRS): SARS Coronavirus 2: NEGATIVE

## 2021-09-08 NOTE — H&P (Signed)
REFERRING PHYSICIAN: Precious Reel, MD  PROVIDER: Nannie Starzyk Charlotta Newton, MD  Chief Complaint: New Consultation (Thyroid neoplasm of uncertain behavior)   History of Present Illness:  Patient is referred by Dr. Shon Baton for surgical evaluation and management of a newly diagnosed thyroid neoplasm of uncertain behavior. Patient had been undergoing a medical evaluation this summer. She was diagnosed with polymyalgia rheumatica. She is currently taking prednisone and is on a taper. As part of her evaluation imaging studies were performed and an incidental finding of a right thyroid nodule was noted. Patient underwent a thyroid ultrasound on June 24, 2021. This demonstrated a solitary 2.1 cm nodule in the mid right thyroid lobe which was felt to be moderately suspicious. Fine-needle aspiration biopsy was performed on July 06, 2021. This was read as suspicious for malignancy, Bethesda category V. Patient is now referred to surgery for recommendations for management. Thyroid function has been normal. Patient was on thyroid hormone for a number of years but was taken off of thyroid medication several years ago by Dr. Virgina Jock and her thyroid functions have remained within the normal range. Patient has had no prior head or neck surgery. There is no family history of thyroid disease or thyroid cancer. Patient presents today accompanied by her husband to review these results and to discuss management.  Review of Systems: A complete review of systems was obtained from the patient. I have reviewed this information and discussed as appropriate with the patient. See HPI as well for other ROS.  Review of Systems  Constitutional: Positive for weight loss.  HENT: Negative.  Eyes: Negative.  Respiratory: Negative.  Cardiovascular: Negative.  Gastrointestinal: Negative.  Genitourinary: Negative.  Musculoskeletal: Positive for joint pain and myalgias.  Skin: Negative.  Neurological: Negative.   Endo/Heme/Allergies: Negative.  Psychiatric/Behavioral: Negative.    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   cholecalciferol (VITAMIN D3) 2,000 unit tablet Take by mouth.   cyanocobalamin 2000 MCG tablet Take by mouth.   cycloSPORINE (RESTASIS) 0.05 % ophthalmic emulsion 1 drop 2 (two) times daily.   docusate (COLACE) 100 MG capsule Take by mouth.   ketorolac (ACULAR LS) 0.4 % ophthalmic solution Place 1 drop into the left eye once daily.   ketorolac (ACULAR) 0.5 % ophthalmic solution Insert one drop 4 times a day into right eye starting 4 days prior to surgery 5 mL 0   lisinopril (PRINIVIL,ZESTRIL) 40 MG tablet Take by mouth.   moxifloxacin (VIGAMOX) 0.5 % ophthalmic solution Insert one drop 3 times a day into surgical eye  starting 4 days prior to surgery (Patient taking differently: Place into the right eye 4 (four) times daily. Insert one drop 3 times a day into surgical eye starting 4 days prior to surgery) 3 mL 0   omega 3-dha-epa-fish oil 900-1,400 mg CpDR Take by  mouth.   omeprazole (PRILOSEC) 40 MG DR capsule TAKE 1 CAPSULE (40 MG TOTAL) BY MOUTH DAILY.   prednisoLONE acetate (PRED FORTE) 1 % ophthalmic suspension Place 1 drop into the right eye every 1 hour.   prednisoLONE acetate (PRED FORTE) 1 % ophthalmic suspension Place 1 drop into the left eye once daily.   simvastatin (ZOCOR) 20 MG tablet Take by mouth.   sodium chloride (MURO 128) 5 % ophthalmic ointment Place into both eyes nightly.   TEA TREE OIL TOP Apply topically.   No current facility-administered medications on file prior to visit.   Family History  Problem Relation Age of Onset   COPD Mother    Social History   Tobacco Use  Smoking Status Never  Smokeless Tobacco Never    Social History   Socioeconomic History   Marital status: Unknown  Tobacco Use   Smoking status: Never   Smokeless tobacco: Never  Substance and Sexual Activity   Alcohol use: Never   Drug use: Never   Objective:   Vitals:  BP: 136/80  Pulse: 89  Temp: 36.6 C (97.8 F)  SpO2: 96%  Weight: 81.5 kg (179 lb 9.6 oz)  Height: 170.2 cm (5\' 7" )   Body mass index is 28.13 kg/m.  Physical Exam   GENERAL APPEARANCE Development: normal Nutritional status: normal Gross deformities: none  SKIN Rash, lesions, ulcers: none Induration, erythema: none Nodules: none palpable  EYES Conjunctiva and lids: normal Pupils: equal and reactive Iris: normal bilaterally  EARS, NOSE, MOUTH, THROAT External ears: no lesion or deformity External nose: no lesion or deformity Hearing: grossly normal Due to Covid-19 pandemic, patient is wearing a mask.  NECK Symmetric: yes Trachea: midline Thyroid: no palpable nodules in the thyroid bed  CHEST Respiratory effort: normal Retraction or accessory muscle use: no Breath sounds: normal bilaterally Rales, rhonchi, wheeze: none  CARDIOVASCULAR Auscultation: regular rhythm, normal rate Murmurs: none Pulses: radial pulse 2+ palpable Lower extremity  edema: none  MUSCULOSKELETAL Station and gait: normal Digits and nails: no clubbing or cyanosis Muscle strength: grossly normal all extremities Range of motion: grossly normal all extremities Deformity: none  LYMPHATIC Cervical: none palpable Supraclavicular: none palpable  PSYCHIATRIC Oriented to person, place, and time: yes Mood and affect: normal for situation Judgment and insight: appropriate for situation  Assessment and Plan:   Neoplasm of uncertain behavior of thyroid gland  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 Shon Baton for surgical evaluation and management of a solitary nodule in the right thyroid lobe which is highly suspicious for malignancy on fine-needle aspiration cytopathology.  Patient provided with a copy of "The Thyroid Book: Medical and Surgical Treatment of Thyroid Problems", published by [employer], 16 pages. Book reviewed and explained to patient during visit today.  Today we reviewed her history, her ultrasound report, and her cytopathology results in detail. I provided copies to the patient and her husband. Based on these findings I have recommended a right thyroid lobectomy for definitive diagnosis and management. Patient has approximately a 90% chance of malignancy in this lesion. However it appears to be small and isolated. There is a chance that it will be benign. We discussed thyroid lobectomy.  We discussed total thyroidectomy. We discussed the risk and benefits including the risk of recurrent laryngeal nerve injury and injury to parathyroid glands. We discussed the size and location of her surgical incision. We discussed the hospital stay to be anticipated. We discussed her postoperative recovery. We discussed the potential need for lifelong thyroid hormone replacement. We discussed the potential need for radioactive iodine treatment and the possibility of additional surgery. The patient and her husband understand and agree to  proceed.  The risks and benefits of the procedure have been discussed at length with the patient. The patient understands the proposed procedure, potential alternative treatments, and the course of recovery to be expected. All of the patient's questions have been answered at this time. The patient wishes to proceed with surgery.   Armandina Gemma, MD [facility] A [place] practice Office: [phone]

## 2021-09-09 ENCOUNTER — Encounter (HOSPITAL_COMMUNITY): Payer: Self-pay | Admitting: Surgery

## 2021-09-09 NOTE — Anesthesia Preprocedure Evaluation (Addendum)
Anesthesia Evaluation  Patient identified by MRN, date of birth, ID band Patient awake    Reviewed: Allergy & Precautions, NPO status , Patient's Chart, lab work & pertinent test results  Airway Mallampati: II  TM Distance: >3 FB Neck ROM: Full    Dental no notable dental hx.    Pulmonary neg pulmonary ROS,    Pulmonary exam normal breath sounds clear to auscultation       Cardiovascular Exercise Tolerance: Good hypertension, Pt. on medications Normal cardiovascular exam Rhythm:Regular Rate:Normal     Neuro/Psych PSYCHIATRIC DISORDERS Anxiety Depression negative neurological ROS     GI/Hepatic Neg liver ROS, GERD  Medicated,  Endo/Other  negative endocrine ROS  Renal/GU negative Renal ROS  negative genitourinary   Musculoskeletal  (+) Arthritis , Osteoarthritis,    Abdominal   Peds negative pediatric ROS (+)  Hematology negative hematology ROS (+)   Anesthesia Other Findings   Reproductive/Obstetrics negative OB ROS                            Anesthesia Physical Anesthesia Plan  ASA: 2  Anesthesia Plan: General   Post-op Pain Management:    Induction:   PONV Risk Score and Plan: 3 and Treatment may vary due to age or medical condition, Ondansetron and Dexamethasone  Airway Management Planned: Oral ETT  Additional Equipment:   Intra-op Plan:   Post-operative Plan: Extubation in OR  Informed Consent: I have reviewed the patients History and Physical, chart, labs and discussed the procedure including the risks, benefits and alternatives for the proposed anesthesia with the patient or authorized representative who has indicated his/her understanding and acceptance.     Dental advisory given  Plan Discussed with: Anesthesiologist, CRNA and Surgeon  Anesthesia Plan Comments:        Anesthesia Quick Evaluation

## 2021-09-10 ENCOUNTER — Other Ambulatory Visit: Payer: Self-pay

## 2021-09-10 ENCOUNTER — Ambulatory Visit (HOSPITAL_COMMUNITY)
Admission: RE | Admit: 2021-09-10 | Discharge: 2021-09-11 | Disposition: A | Discharge status: Home / Self Care | Payer: Medicare Other | Attending: Surgery | Admitting: Surgery

## 2021-09-10 ENCOUNTER — Ambulatory Visit (HOSPITAL_COMMUNITY): Payer: Medicare Other | Admitting: Anesthesiology

## 2021-09-10 ENCOUNTER — Encounter (HOSPITAL_COMMUNITY): Payer: Self-pay | Admitting: Surgery

## 2021-09-10 ENCOUNTER — Ambulatory Visit (HOSPITAL_COMMUNITY): Payer: Medicare Other | Admitting: Physician Assistant

## 2021-09-10 ENCOUNTER — Encounter (HOSPITAL_COMMUNITY)
Admission: RE | Disposition: A | Discharge status: Home / Self Care | Payer: Self-pay | Source: Home / Self Care | Attending: Surgery

## 2021-09-10 DIAGNOSIS — Z7952 Long term (current) use of systemic steroids: Secondary | ICD-10-CM | POA: Insufficient documentation

## 2021-09-10 DIAGNOSIS — D440 Neoplasm of uncertain behavior of thyroid gland: Secondary | ICD-10-CM

## 2021-09-10 DIAGNOSIS — M353 Polymyalgia rheumatica: Secondary | ICD-10-CM | POA: Insufficient documentation

## 2021-09-10 DIAGNOSIS — D34 Benign neoplasm of thyroid gland: Secondary | ICD-10-CM | POA: Diagnosis not present

## 2021-09-10 DIAGNOSIS — E041 Nontoxic single thyroid nodule: Secondary | ICD-10-CM | POA: Diagnosis present

## 2021-09-10 HISTORY — PX: THYROID LOBECTOMY: SHX420

## 2021-09-10 SURGERY — LOBECTOMY, THYROID
Anesthesia: General | Laterality: Right

## 2021-09-10 MED ORDER — ORAL CARE MOUTH RINSE: 15.0000 mL | Freq: Once | OROMUCOSAL | Status: AC

## 2021-09-10 MED ORDER — LISINOPRIL 20 MG PO TABS
40.0000 mg | ORAL_TABLET | Freq: Every day | ORAL | Status: DC
Start: 2021-09-10 — End: 2021-09-11
  Administered 2021-09-10: 40 mg via ORAL
  Filled 2021-09-10: qty 2

## 2021-09-10 MED ORDER — HEMOSTATIC AGENTS (NO CHARGE) OPTIME
TOPICAL | Status: DC | PRN
  Administered 2021-09-10: 1 via TOPICAL

## 2021-09-10 MED ORDER — EPHEDRINE SULFATE-NACL 50-0.9 MG/10ML-% IV SOSY
PREFILLED_SYRINGE | INTRAVENOUS | Status: DC | PRN
  Administered 2021-09-10: 5 mg via INTRAVENOUS
  Administered 2021-09-10: 10 mg via INTRAVENOUS

## 2021-09-10 MED ORDER — LIDOCAINE 2% (20 MG/ML) 5 ML SYRINGE
INTRAMUSCULAR | Status: DC | PRN
Start: 2021-09-10 — End: 2021-09-10
  Administered 2021-09-10: 80 mg via INTRAVENOUS

## 2021-09-10 MED ORDER — PROPOFOL 10 MG/ML IV BOLUS
INTRAVENOUS | Status: AC
  Filled 2021-09-10: qty 20

## 2021-09-10 MED ORDER — ONDANSETRON HCL 4 MG/2ML IJ SOLN
INTRAMUSCULAR | Status: AC
  Filled 2021-09-10: qty 4

## 2021-09-10 MED ORDER — LIDOCAINE 2% (20 MG/ML) 5 ML SYRINGE
INTRAMUSCULAR | Status: AC
  Filled 2021-09-10: qty 10

## 2021-09-10 MED ORDER — FENTANYL CITRATE (PF) 100 MCG/2ML IJ SOLN
INTRAMUSCULAR | Status: AC
  Filled 2021-09-10: qty 2

## 2021-09-10 MED ORDER — DEXMEDETOMIDINE (PRECEDEX) IN NS 20 MCG/5ML (4 MCG/ML) IV SYRINGE
PREFILLED_SYRINGE | INTRAVENOUS | Status: AC
  Filled 2021-09-10: qty 10

## 2021-09-10 MED ORDER — ACETAMINOPHEN 500 MG PO TABS
1000.0000 mg | ORAL_TABLET | Freq: Once | ORAL | Status: AC
  Administered 2021-09-10: 1000 mg via ORAL
  Filled 2021-09-10: qty 2

## 2021-09-10 MED ORDER — ONDANSETRON HCL 4 MG/2ML IJ SOLN: 4.0000 mg | Freq: Four times a day (QID) | INTRAMUSCULAR | Status: DC | PRN

## 2021-09-10 MED ORDER — AMISULPRIDE (ANTIEMETIC) 5 MG/2ML IV SOLN: 10.0000 mg | Freq: Once | INTRAVENOUS | Status: DC | PRN

## 2021-09-10 MED ORDER — 0.9 % SODIUM CHLORIDE (POUR BTL) OPTIME
TOPICAL | Status: DC | PRN
  Administered 2021-09-10: 1000 mL

## 2021-09-10 MED ORDER — ROCURONIUM BROMIDE 10 MG/ML (PF) SYRINGE
PREFILLED_SYRINGE | INTRAVENOUS | Status: AC
  Filled 2021-09-10: qty 20

## 2021-09-10 MED ORDER — IBUPROFEN 400 MG PO TABS: 600.0000 mg | ORAL_TABLET | Freq: Four times a day (QID) | ORAL | Status: DC | PRN

## 2021-09-10 MED ORDER — CHLORHEXIDINE GLUCONATE 0.12 % MT SOLN
15.0000 mL | Freq: Once | OROMUCOSAL | Status: AC
  Administered 2021-09-10: 15 mL via OROMUCOSAL

## 2021-09-10 MED ORDER — ONDANSETRON HCL 4 MG/2ML IJ SOLN
INTRAMUSCULAR | Status: DC | PRN
  Administered 2021-09-10: 4 mg via INTRAVENOUS

## 2021-09-10 MED ORDER — LACTATED RINGERS IV SOLN: INTRAVENOUS | Status: DC

## 2021-09-10 MED ORDER — ARTIFICIAL TEARS OPHTHALMIC OINT
TOPICAL_OINTMENT | OPHTHALMIC | Status: AC
  Filled 2021-09-10: qty 3.5

## 2021-09-10 MED ORDER — DEXAMETHASONE SODIUM PHOSPHATE 10 MG/ML IJ SOLN
INTRAMUSCULAR | Status: AC
  Filled 2021-09-10: qty 2

## 2021-09-10 MED ORDER — FENTANYL CITRATE PF 50 MCG/ML IJ SOSY: 25.0000 ug | PREFILLED_SYRINGE | INTRAMUSCULAR | Status: DC | PRN

## 2021-09-10 MED ORDER — ONDANSETRON 4 MG PO TBDP: 4.0000 mg | ORAL_TABLET | Freq: Four times a day (QID) | ORAL | Status: DC | PRN

## 2021-09-10 MED ORDER — CEFAZOLIN SODIUM-DEXTROSE 2-4 GM/100ML-% IV SOLN
2.0000 g | INTRAVENOUS | Status: AC
  Administered 2021-09-10: 2 g via INTRAVENOUS
  Filled 2021-09-10: qty 100

## 2021-09-10 MED ORDER — CHLORHEXIDINE GLUCONATE CLOTH 2 % EX PADS: 6.0000 | MEDICATED_PAD | Freq: Once | CUTANEOUS | Status: DC

## 2021-09-10 MED ORDER — SUGAMMADEX SODIUM 200 MG/2ML IV SOLN
INTRAVENOUS | Status: DC | PRN
  Administered 2021-09-10: 350 mg via INTRAVENOUS

## 2021-09-10 MED ORDER — OXYCODONE HCL 5 MG PO TABS
5.0000 mg | ORAL_TABLET | ORAL | Status: DC | PRN
  Administered 2021-09-10: 5 mg via ORAL

## 2021-09-10 MED ORDER — ACETAMINOPHEN 650 MG RE SUPP: 650.0000 mg | Freq: Four times a day (QID) | RECTAL | Status: DC | PRN

## 2021-09-10 MED ORDER — DEXAMETHASONE SODIUM PHOSPHATE 10 MG/ML IJ SOLN
INTRAMUSCULAR | Status: DC | PRN
  Administered 2021-09-10: 8 mg via INTRAVENOUS

## 2021-09-10 MED ORDER — ONDANSETRON HCL 4 MG/2ML IJ SOLN: 4.0000 mg | Freq: Once | INTRAMUSCULAR | Status: DC | PRN

## 2021-09-10 MED ORDER — PREDNISONE 5 MG PO TABS: 10.0000 mg | ORAL_TABLET | Freq: Every day | ORAL | Status: DC

## 2021-09-10 MED ORDER — DEXMEDETOMIDINE (PRECEDEX) IN NS 20 MCG/5ML (4 MCG/ML) IV SYRINGE
PREFILLED_SYRINGE | INTRAVENOUS | Status: DC | PRN
  Administered 2021-09-10: 4 ug via INTRAVENOUS

## 2021-09-10 MED ORDER — ROCURONIUM BROMIDE 10 MG/ML (PF) SYRINGE
PREFILLED_SYRINGE | INTRAVENOUS | Status: DC | PRN
  Administered 2021-09-10: 70 mg via INTRAVENOUS

## 2021-09-10 MED ORDER — ACETAMINOPHEN 325 MG PO TABS: 650.0000 mg | ORAL_TABLET | Freq: Four times a day (QID) | ORAL | Status: DC | PRN

## 2021-09-10 MED ORDER — SODIUM CHLORIDE 0.45 % IV SOLN: INTRAVENOUS | Status: DC

## 2021-09-10 MED ORDER — OXYCODONE HCL 5 MG PO TABS
ORAL_TABLET | ORAL | Status: AC
  Filled 2021-09-10: qty 1

## 2021-09-10 MED ORDER — ALPRAZOLAM 1 MG PO TABS
1.0000 mg | ORAL_TABLET | Freq: Every day | ORAL | Status: DC
  Administered 2021-09-10: 1 mg via ORAL
  Filled 2021-09-10: qty 1

## 2021-09-10 MED ORDER — PROPOFOL 10 MG/ML IV BOLUS
INTRAVENOUS | Status: DC | PRN
  Administered 2021-09-10: 120 mg via INTRAVENOUS

## 2021-09-10 MED ORDER — FENTANYL CITRATE (PF) 100 MCG/2ML IJ SOLN
INTRAMUSCULAR | Status: DC | PRN
Start: 2021-09-10 — End: 2021-09-10
  Administered 2021-09-10: 50 ug via INTRAVENOUS
  Administered 2021-09-10: 100 ug via INTRAVENOUS

## 2021-09-10 MED ORDER — HYDROMORPHONE HCL 1 MG/ML IJ SOLN: 1.0000 mg | INTRAMUSCULAR | Status: DC | PRN

## 2021-09-10 SURGICAL SUPPLY — 34 items
ADH SKN CLS APL DERMABOND .7 (GAUZE/BANDAGES/DRESSINGS) ×1
APL PRP STRL LF DISP 70% ISPRP (MISCELLANEOUS) ×1
ATTRACTOMAT 16X20 MAGNETIC DRP (DRAPES) ×2 IMPLANT
BAG COUNTER SPONGE SURGICOUNT (BAG) ×2 IMPLANT
BAG SPNG CNTER NS LX DISP (BAG) ×1
BLADE SURG 15 STRL LF DISP TIS (BLADE) ×1 IMPLANT
BLADE SURG 15 STRL SS (BLADE) ×2
CHLORAPREP W/TINT 26 (MISCELLANEOUS) ×2 IMPLANT
CLIP TI MEDIUM 6 (CLIP) ×4 IMPLANT
CLIP TI WIDE RED SMALL 6 (CLIP) ×4 IMPLANT
COVER SURGICAL LIGHT HANDLE (MISCELLANEOUS) ×2 IMPLANT
DERMABOND ADVANCED (GAUZE/BANDAGES/DRESSINGS) ×1
DERMABOND ADVANCED .7 DNX12 (GAUZE/BANDAGES/DRESSINGS) ×1 IMPLANT
DRAPE LAPAROTOMY T 98X78 PEDS (DRAPES) ×2 IMPLANT
DRAPE UTILITY XL STRL (DRAPES) ×2 IMPLANT
ELECT PENCIL ROCKER SW 15FT (MISCELLANEOUS) ×2 IMPLANT
ELECT REM PT RETURN 15FT ADLT (MISCELLANEOUS) ×2 IMPLANT
GAUZE 4X4 16PLY ~~LOC~~+RFID DBL (SPONGE) ×2 IMPLANT
GLOVE SURG SYN 7.5  E (GLOVE) ×4
GLOVE SURG SYN 7.5 E (GLOVE) ×2 IMPLANT
GLOVE SURG SYN 7.5 PF PI (GLOVE) ×2 IMPLANT
GOWN STRL REUS W/TWL XL LVL3 (GOWN DISPOSABLE) ×4 IMPLANT
HEMOSTAT SURGICEL 2X4 FIBR (HEMOSTASIS) ×2 IMPLANT
ILLUMINATOR WAVEGUIDE N/F (MISCELLANEOUS) ×2 IMPLANT
KIT BASIN OR (CUSTOM PROCEDURE TRAY) ×2 IMPLANT
KIT TURNOVER KIT A (KITS) IMPLANT
PACK BASIC VI WITH GOWN DISP (CUSTOM PROCEDURE TRAY) ×2 IMPLANT
SHEARS HARMONIC 9CM CVD (BLADE) ×1 IMPLANT
SUT MNCRL AB 4-0 PS2 18 (SUTURE) ×2 IMPLANT
SUT VIC AB 3-0 SH 18 (SUTURE) ×4 IMPLANT
SYR BULB IRRIG 60ML STRL (SYRINGE) ×2 IMPLANT
TOWEL OR 17X26 10 PK STRL BLUE (TOWEL DISPOSABLE) ×2 IMPLANT
TOWEL OR NON WOVEN STRL DISP B (DISPOSABLE) ×2 IMPLANT
TUBING CONNECTING 10 (TUBING) ×2 IMPLANT

## 2021-09-10 NOTE — Transfer of Care (Signed)
Immediate Anesthesia Transfer of Care Note  Patient: Stephanie Beasley  Procedure(s) Performed: Procedure(s): RIGHT THYROID LOBECTOMY (Right)  Patient Location: PACU  Anesthesia Type:General  Level of Consciousness:  sedated, patient cooperative and responds to stimulation  Airway & Oxygen Therapy:Patient Spontanous Breathing and Patient connected to face mask oxgen  Post-op Assessment:  Report given to PACU RN and Post -op Vital signs reviewed and stable  Post vital signs:  Reviewed and stable  Last Vitals:  Vitals:   09/10/21 0548  BP: 125/81  Pulse: 75  Resp: 18  Temp: 36.7 C  SpO2: 97%    Complications: No apparent anesthesia complications

## 2021-09-10 NOTE — Anesthesia Procedure Notes (Signed)
Procedure Name: Intubation Date/Time: 09/10/2021 7:34 AM Performed by: Lavina Hamman, CRNA Pre-anesthesia Checklist: Patient identified, Emergency Drugs available, Suction available, Patient being monitored and Timeout performed Patient Re-evaluated:Patient Re-evaluated prior to induction Oxygen Delivery Method: Circle system utilized Preoxygenation: Pre-oxygenation with 100% oxygen Induction Type: IV induction Ventilation: Mask ventilation without difficulty Laryngoscope Size: Mac and 3 Grade View: Grade II Tube type: Oral Tube size: 7.0 mm Number of attempts: 1 Airway Equipment and Method: Stylet Placement Confirmation: ETT inserted through vocal cords under direct vision, positive ETCO2, CO2 detector and breath sounds checked- equal and bilateral Secured at: 21 cm Tube secured with: Tape Dental Injury: Teeth and Oropharynx as per pre-operative assessment  Comments: ATOI

## 2021-09-10 NOTE — Anesthesia Postprocedure Evaluation (Signed)
Anesthesia Post Note  Patient: Stephanie Beasley  Procedure(s) Performed: RIGHT THYROID LOBECTOMY (Right)     Patient location during evaluation: PACU Anesthesia Type: General Level of consciousness: awake Pain management: pain level controlled Vital Signs Assessment: post-procedure vital signs reviewed and stable Respiratory status: spontaneous breathing and respiratory function stable Cardiovascular status: stable Postop Assessment: no apparent nausea or vomiting Anesthetic complications: no   No notable events documented.  Last Vitals:  Vitals:   09/10/21 0900 09/10/21 0915  BP: 126/80 129/72  Pulse: 78 75  Resp: 13 20  Temp:  36.8 C  SpO2: 100% 93%    Last Pain:  Vitals:   09/10/21 0915  TempSrc:   PainSc: 0-No pain                 Merlinda Frederick

## 2021-09-10 NOTE — Interval H&P Note (Signed)
History and Physical Interval Note:  09/10/2021 6:59 AM  Stephanie Beasley  has presented today for surgery, with the diagnosis of THYROID NEOPLASM OF UNCERTAIN BEHAVIOR.  The various methods of treatment have been discussed with the patient and family. After consideration of risks, benefits and other options for treatment, the patient has consented to    Procedure(s): RIGHT THYROID LOBECTOMY (Right) as a surgical intervention.    The patient's history has been reviewed, patient examined, no change in status, stable for surgery.  I have reviewed the patient's chart and labs.  Questions were answered to the patient's satisfaction.    Armandina Gemma, [facility] [place] practice Office: [place]

## 2021-09-10 NOTE — Op Note (Signed)
Procedure Note  Pre-operative Diagnosis:  Thyroid neoplasm of uncertain behavior, Bethesda category V  Post-operative Diagnosis:  same  Surgeon:  Armandina Gemma, MD  Assistant:  Mohammed Kindle, RN   Procedure:  Right thyroid lobectomy and isthmusectomy  Anesthesia:  General  Estimated Blood Loss:  minimal  Drains: none         Specimen: thyroid lobe to pathology  Indications:  Patient is referred by Dr. Shon Baton for surgical evaluation and management of a newly diagnosed thyroid neoplasm of uncertain behavior. Patient had been undergoing a medical evaluation this summer. She was diagnosed with polymyalgia rheumatica. She is currently taking prednisone and is on a taper. As part of her evaluation imaging studies were performed and an incidental finding of a right thyroid nodule was noted. Patient underwent a thyroid ultrasound on June 24, 2021. This demonstrated a solitary 2.1 cm nodule in the mid right thyroid lobe which was felt to be moderately suspicious. Fine-needle aspiration biopsy was performed on July 06, 2021. This was read as suspicious for malignancy, Bethesda category V. Patient is now referred to surgery for recommendations for management.   Procedure Details: Procedure was done in OR #1 at the [facility]. The patient was brought to the operating room and placed in a supine position on the operating room table. Following administration of general anesthesia, the patient was positioned and then prepped and draped in the usual aseptic fashion. After ascertaining that an adequate level of anesthesia had been achieved, a small Kocher incision was made with #15 blade. Dissection was carried through subcutaneous tissues and platysma. Hemostasis was achieved with the electrocautery. Skin flaps were elevated cephalad and caudad from the thyroid notch to the sternal notch. A self-retaining retractor was placed for exposure. Strap muscles were incised in the midline and  dissection was begun on the right side. Strap muscles were reflected laterally. The right thyroid lobe was moderately enlarged with a firm, calcified mass in the anterior portion of the superior pole. The lobe was gently mobilized with blunt dissection. Superior pole vessels were dissected out and divided individually between small and medium ligaclips with the harmonic scalpel. The thyroid lobe was rolled anteriorly. Branches of the inferior thyroid artery were divided between small ligaclips with the harmonic scalpel. Inferior venous tributaries were divided between ligaclips. Both the superior and inferior parathyroid glands were identified and preserved on their vascular pedicles. The recurrent laryngeal nerve was identified and preserved along its course. The ligament of Gwenlyn Found was released with the electrocautery and the gland was mobilized onto the anterior trachea. Isthmus was mobilized across the midline. There was no significant pyramidal lobe present. The thyroid parenchyma was transected at the junction of the isthmus and contralateral thyroid lobe with the harmonic scalpel. The thyroid lobe and isthmus were submitted to pathology for review.  The neck was irrigated with warm saline. Fibrillar was placed throughout the operative field. Strap muscles were approximated in the midline with interrupted 3-0 Vicryl sutures. Platysma was closed with interrupted 3-0 Vicryl sutures. Skin was closed with a running 4-0 Monocryl subcuticular suture.  Wound was washed and dried and Dermabond was applied. The patient was awakened from anesthesia and brought to the recovery room. The patient tolerated the procedure well.   Armandina Gemma, MD [facility] Office: [phone]

## 2021-09-10 NOTE — Progress Notes (Signed)
Transition of Care (TOC) Screening Note  Patient Details  Name: TANGALA WIEGERT Date of Birth: 03/28/1941  Transition of Care [facility]) CM/SW Contact:    Sherie Don, LCSW Phone Number: 09/10/2021, 3:23 PM  Transition of Care Department [facility]) has reviewed patient and no TOC needs have been identified at this time. We will continue to monitor patient advancement through interdisciplinary progression rounds. If new patient transition needs arise, please place a TOC consult.

## 2021-09-10 NOTE — Plan of Care (Signed)

## 2021-09-11 ENCOUNTER — Encounter (HOSPITAL_COMMUNITY): Payer: Self-pay | Admitting: Surgery

## 2021-09-11 DIAGNOSIS — Z7952 Long term (current) use of systemic steroids: Secondary | ICD-10-CM | POA: Diagnosis not present

## 2021-09-11 DIAGNOSIS — D34 Benign neoplasm of thyroid gland: Secondary | ICD-10-CM | POA: Diagnosis not present

## 2021-09-11 DIAGNOSIS — M353 Polymyalgia rheumatica: Secondary | ICD-10-CM | POA: Diagnosis not present

## 2021-09-11 MED ORDER — TRAMADOL HCL 50 MG PO TABS: 50.0000 mg | ORAL_TABLET | Freq: Four times a day (QID) | ORAL | 0 refills | Status: DC | PRN

## 2021-09-11 NOTE — Discharge Instructions (Signed)
[facility] - Dr. Masen Salvas  THYROID & PARATHYROID SURGERY:  POST-OP INSTRUCTIONS  Always review the instruction sheet provided by the hospital nurse at discharge.  A prescription for pain medication may be sent to your pharmacy at the time of discharge.  Take your pain medication as prescribed.  If narcotic pain medicine is not needed, then you may take acetaminophen (Tylenol) or ibuprofen (Advil) as needed for pain or soreness.  Take your normal home medications as prescribed unless otherwise directed.  If you need a refill on your pain medication, please contact the office during regular business hours.  Prescriptions will not be processed by the office after 5:00PM or on weekends.  Start with a light diet upon arrival home, such as soup and crackers or toast.  Be sure to drink plenty of fluids.  Resume your normal diet the day after surgery.  Most patients will experience some swelling and bruising on the chest and neck area.  Ice packs will help for the first 48 hours after arriving home.  Swelling and bruising will take several days to resolve.   It is common to experience some constipation after surgery.  Increasing fluid intake and taking a stool softener (Colace) will usually help to prevent this problem.  A mild laxative (Milk of Magnesia or Miralax) should be taken according to package directions if there has been no bowel movement after 48 hours.  Dermabond glue covers your incision. This seals the wound and you may shower at any time. The Dermabond will remain in place for about a week.  You may gradually remove the glue when it loosens around the edges.  If you need to loosen the Dermabond for removal, apply a layer of Vaseline to the wound for 15 minutes and then remove with a Kleenex. Your sutures are under the skin and will not show - they will dissolve on their own.  You may resume light daily activities beginning the day after discharge (such as self-care,  walking, climbing stairs), gradually increasing activities as tolerated. You may have sexual intercourse when it is comfortable. Refrain from any heavy lifting or straining until approved by your doctor. You may drive when you no longer are taking prescription pain medication, you can comfortably wear a seatbelt, and you can safely maneuver your car and apply the brakes.  You will see your doctor in the office for a follow-up appointment approximately three weeks after your surgery.  Make sure that you call for this appointment within a day or two after you arrive home to insure a convenient appointment time. Please have any requested laboratory tests performed a few days prior to your office visit so that the results will be available at your follow up appointment.  WHEN TO CALL THE [facility] OFFICE: -- Fever greater than 101.5 -- Inability to urinate -- Nausea and/or vomiting - persistent -- Extreme swelling or bruising -- Continued bleeding from incision -- Increased pain, redness, or drainage from the incision -- Difficulty swallowing or breathing -- Muscle cramping or spasms -- Numbness or tingling in hands or around lips  The clinic staff is available to answer your questions during regular business hours.  Please don't hesitate to call and ask to speak to one of the nurses if you have concerns.  [facility] OFFICE: [phone] (24 hours)  Please sign up for MyChart accounts. This will allow you to communicate directly with my nurse or myself without having to call the office. It will also allow you   to view your test results. You will need to enroll in MyChart for my office ([facility]) and for the hospital ([facility]).  Wessley Emert, MD [facility] practice 

## 2021-09-11 NOTE — Discharge Summary (Signed)
Physician Discharge Summary   Patient ID: Stephanie Beasley MRN: [id number] DOB/AGE: 12/27/1940 81 y.o.  Admit date: 09/10/2021  Discharge date: 09/11/2021  Discharge Diagnoses:  Principal Problem:   Neoplasm of uncertain behavior of thyroid gland Active Problems:   Thyroid nodule, uninodular   Discharged Condition: good  Hospital Course: Patient was admitted for observation following thyroid surgery.  Post op course was uncomplicated.  Pain was well controlled.  Tolerated diet.  Patient was prepared for discharge home on POD#1.   Consults: None  Treatments: surgery: right thyroid lobectomy and isthmusectomy  Discharge Exam: Blood pressure 115/85, pulse 76, temperature 97.7 F (36.5 C), temperature source Oral, resp. rate 18, height 5\' 7"  (1.702 m), weight 81.2 kg, SpO2 99 %. HEENT - clear Neck - wound dry and intact; minimal STS; slight ecchymosis; voice normal  Disposition: Home  Discharge Instructions     Diet - low sodium heart healthy   Complete by: As directed    Increase activity slowly   Complete by: As directed    No dressing needed   Complete by: As directed       Allergies as of 09/11/2021       Reactions   Hydrocodone Other (See Comments)   Causes patient to get the shakes        Medication List     TAKE these medications    alendronate 70 MG tablet Commonly known as: FOSAMAX Take 70 mg by mouth every 14 (fourteen) days. Take with a full glass of water on an empty stomach.   ALPRAZolam 1 MG tablet Commonly known as: XANAX Take 1 mg by mouth at bedtime.   B-12 2500 MCG Tabs Take 2,500 mcg by mouth daily.   carboxymethylcellulose 0.5 % Soln Commonly known as: REFRESH PLUS Place 1 drop into both eyes 3 (three) times daily as needed (dry eyes).   cholecalciferol 25 MCG (1000 UNIT) tablet Commonly known as: VITAMIN D Take 1,000 Units by mouth every evening.   famotidine-calcium carbonate-magnesium hydroxide 10-800-165 MG chewable  tablet Commonly known as: PEPCID COMPLETE Chew 1 tablet by mouth 2 (two) times daily as needed (acid reflux/indigestion.).   lisinopril 40 MG tablet Commonly known as: ZESTRIL Take 40 mg by mouth daily.   loratadine 10 MG tablet Commonly known as: CLARITIN Take 10 mg by mouth daily.   predniSONE 5 MG tablet Commonly known as: DELTASONE Take 10 mg by mouth daily.   simvastatin 20 MG tablet Commonly known as: ZOCOR Take 20 mg by mouth daily with supper.   sodium chloride 5 % ophthalmic ointment Commonly known as: MURO 659 Place 1 application into both eyes at bedtime.   traMADol 50 MG tablet Commonly known as: ULTRAM Take 1-2 tablets (50-100 mg total) by mouth every 6 (six) hours as needed.               Discharge Care Instructions  (From admission, onward)           Start     Ordered   09/11/21 0000  No dressing needed        09/11/21 9357            Follow-up Information     Armandina Gemma, MD. Schedule an appointment as soon as possible for a visit in 3 week(s).   Specialty: General Surgery Why: For wound re-check Contact information: [address] [place] [address] [phone]  Armandina Gemma, [facility] Office: [phone]   Signed: Armandina Gemma 09/11/2021, 8:01 AM

## 2021-09-15 LAB — SURGICAL PATHOLOGY

## 2021-09-15 NOTE — Progress Notes (Signed)
Final pathology is BENIGN.  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  Telephone call to patient this morning with these results.  Angelica, MD [facility] [place] practice Office: [phone]

## 2021-09-16 NOTE — Progress Notes (Signed)
Standard of care.  [place], MD [facility] [place] practice Office: [phone]

## 2021-09-22 DIAGNOSIS — M3500 Sicca syndrome, unspecified: Secondary | ICD-10-CM | POA: Diagnosis not present

## 2021-09-22 DIAGNOSIS — E663 Overweight: Secondary | ICD-10-CM | POA: Diagnosis not present

## 2021-09-22 DIAGNOSIS — M159 Polyosteoarthritis, unspecified: Secondary | ICD-10-CM | POA: Diagnosis not present

## 2021-09-22 DIAGNOSIS — Z1589 Genetic susceptibility to other disease: Secondary | ICD-10-CM | POA: Diagnosis not present

## 2021-09-22 DIAGNOSIS — M353 Polymyalgia rheumatica: Secondary | ICD-10-CM | POA: Diagnosis not present

## 2021-09-22 DIAGNOSIS — Z6828 Body mass index (BMI) 28.0-28.9, adult: Secondary | ICD-10-CM | POA: Diagnosis not present

## 2021-09-28 DIAGNOSIS — H04123 Dry eye syndrome of bilateral lacrimal glands: Secondary | ICD-10-CM | POA: Diagnosis not present

## 2021-10-04 DIAGNOSIS — E538 Deficiency of other specified B group vitamins: Secondary | ICD-10-CM | POA: Diagnosis not present

## 2021-10-04 DIAGNOSIS — E041 Nontoxic single thyroid nodule: Secondary | ICD-10-CM | POA: Diagnosis not present

## 2021-10-04 DIAGNOSIS — I10 Essential (primary) hypertension: Secondary | ICD-10-CM | POA: Diagnosis not present

## 2021-10-04 DIAGNOSIS — E785 Hyperlipidemia, unspecified: Secondary | ICD-10-CM | POA: Diagnosis not present

## 2021-10-04 DIAGNOSIS — M8580 Other specified disorders of bone density and structure, unspecified site: Secondary | ICD-10-CM | POA: Diagnosis not present

## 2021-10-04 DIAGNOSIS — R739 Hyperglycemia, unspecified: Secondary | ICD-10-CM | POA: Diagnosis not present

## 2021-10-06 DIAGNOSIS — Z9009 Acquired absence of other part of head and neck: Secondary | ICD-10-CM | POA: Insufficient documentation

## 2021-10-08 ENCOUNTER — Other Ambulatory Visit: Payer: Self-pay

## 2021-10-08 ENCOUNTER — Encounter: Payer: Self-pay | Admitting: Podiatry

## 2021-10-08 ENCOUNTER — Other Ambulatory Visit: Payer: Self-pay | Admitting: Internal Medicine

## 2021-10-08 ENCOUNTER — Ambulatory Visit (INDEPENDENT_AMBULATORY_CARE_PROVIDER_SITE_OTHER): Payer: Medicare Other | Admitting: Podiatry

## 2021-10-08 DIAGNOSIS — L600 Ingrowing nail: Secondary | ICD-10-CM

## 2021-10-08 DIAGNOSIS — Z1231 Encounter for screening mammogram for malignant neoplasm of breast: Secondary | ICD-10-CM

## 2021-10-08 NOTE — Progress Notes (Signed)
°  Subjective:  Patient ID: Stephanie Beasley, female    DOB: 10-03-1940,   MRN: [id number]  Chief Complaint  Patient presents with   Nail Problem    (np) right foot possible  ingrown toenail    81 y.o. female presents for concern of ingrown toenail on her right great toe. Has tried trimming it back and actually cut her skin. Relates it is feeling more painful. Has been soaking.  . Denies any other pedal complaints. Denies n/v/f/c.   Past Medical History:  Diagnosis Date   Anal fissure    Cataract    surgery to remove in 2016 -bilateral   Depression with anxiety    Diverticulitis of colon    ? 2011   Esophageal stricture    GERD (gastroesophageal reflux disease)    Hyperlipidemia    Hypertension    Insomnia    Osteoarthritis    knees   Osteopenia    Posterior vitreous detachment    Tubular adenoma polyp of rectum    Vitamin B12 deficiency    Vitamin D insufficiency     Objective:  Physical Exam: Vascular: DP/PT pulses 2/4 bilateral. CFT <3 seconds. Normal hair growth on digits. No edema.  Skin. No lacerations or abrasions bilateral feet. Incurvation of right hallux lateral border with mild erythema surrounding. No edema or purulence noted.  Musculoskeletal: MMT 5/5 bilateral lower extremities in DF, PF, Inversion and Eversion. Deceased ROM in DF of ankle joint.  Neurological: Sensation intact to light touch.   Assessment:  No diagnosis found.   Plan:  Patient was evaluated and treated and all questions answered. Patient requesting removal of ingrown nail today. Procedure below.  Discussed procedure and post procedure care and patient expressed understanding.  Will follow-up in 2 weeks for nail check or sooner if any problems arise.    Procedure:  Procedure: partial Nail Avulsion of right hallux lateral nail border.  Surgeon: Lorenda Peck, DPM  Pre-op Dx: Ingrown toenail without infection Post-op: Same  Place of Surgery: Office exam room.  Indications for surgery:  Painful and ingrown toenail.    The patient is requesting removal of nail with chemical matrixectomy. Risks and complications were discussed with the patient for which they understand and written consent was obtained. Under sterile conditions a total of 3 mL of  1% lidocaine plain was infiltrated in a hallux block fashion. Once anesthetized, the skin was prepped in sterile fashion. A tourniquet was then applied. Next the lateral aspect of hallux nail border was then sharply excised making sure to remove the entire offending nail border.  Next phenol was then applied under standard conditions and copiously irrigated. Silvadene was applied. A dry sterile dressing was applied. After application of the dressing the tourniquet was removed and there is found to be an immediate capillary refill time to the digit. The patient tolerated the procedure well without any complications. Post procedure instructions were discussed the patient for which he verbally understood. Follow-up in two weeks for nail check or sooner if any problems are to arise. Discussed signs/symptoms of infection and directed to call the office immediately should any occur or go directly to the emergency room. In the meantime, encouraged to call the office with any questions, concerns, changes symptoms.   Lorenda Peck, DPM

## 2021-10-08 NOTE — Patient Instructions (Signed)

## 2021-10-11 DIAGNOSIS — Z1331 Encounter for screening for depression: Secondary | ICD-10-CM | POA: Diagnosis not present

## 2021-10-11 DIAGNOSIS — Z23 Encounter for immunization: Secondary | ICD-10-CM | POA: Diagnosis not present

## 2021-10-11 DIAGNOSIS — R82998 Other abnormal findings in urine: Secondary | ICD-10-CM | POA: Diagnosis not present

## 2021-10-13 ENCOUNTER — Ambulatory Visit (INDEPENDENT_AMBULATORY_CARE_PROVIDER_SITE_OTHER): Payer: Medicare Other

## 2021-10-13 ENCOUNTER — Other Ambulatory Visit: Payer: Self-pay

## 2021-10-13 DIAGNOSIS — Z1231 Encounter for screening mammogram for malignant neoplasm of breast: Secondary | ICD-10-CM

## 2021-10-22 ENCOUNTER — Encounter: Payer: Self-pay | Admitting: Podiatry

## 2021-10-22 ENCOUNTER — Ambulatory Visit (INDEPENDENT_AMBULATORY_CARE_PROVIDER_SITE_OTHER): Payer: Medicare Other | Admitting: Podiatry

## 2021-10-22 ENCOUNTER — Other Ambulatory Visit: Payer: Self-pay

## 2021-10-22 DIAGNOSIS — L600 Ingrowing nail: Secondary | ICD-10-CM

## 2021-10-22 NOTE — Progress Notes (Signed)
°  Subjective:  Patient ID: Stephanie Beasley, female    DOB: 1941-07-31,   MRN: [id number]  Chief Complaint  Patient presents with   Nail Problem    Right foot nail check    81 y.o. female presents for follow-up of right great toe ingrown nail procedure. Relates she is doing well. Has been soaking and doing neosporin and bandaid  . Denies any other pedal complaints. Denies n/v/f/c.   Past Medical History:  Diagnosis Date   Anal fissure    Cataract    surgery to remove in 2016 -bilateral   Depression with anxiety    Diverticulitis of colon    ? 2011   Esophageal stricture    GERD (gastroesophageal reflux disease)    Hyperlipidemia    Hypertension    Insomnia    Osteoarthritis    knees   Osteopenia    Posterior vitreous detachment    Tubular adenoma polyp of rectum    Vitamin B12 deficiency    Vitamin D insufficiency     Objective:  Physical Exam: Vascular: DP/PT pulses 2/4 bilateral. CFT <3 seconds. Normal hair growth on digits. No edema.  Skin. No lacerations or abrasions bilateral feet. Right hallux nail healing well no signs of infection.  Musculoskeletal: MMT 5/5 bilateral lower extremities in DF, PF, Inversion and Eversion. Deceased ROM in DF of ankle joint.  Neurological: Sensation intact to light touch.   Assessment:   1. Ingrown nail of great toe of right foot      Plan:  Patient was evaluated and treated and all questions answered. Toe was evaluated and appears to be healing well.  May discontinue soaks and neosporin.  Patient to follow-up as needed.    Lorenda Peck, DPM

## 2021-11-22 DIAGNOSIS — E041 Nontoxic single thyroid nodule: Secondary | ICD-10-CM | POA: Diagnosis not present

## 2021-11-22 DIAGNOSIS — M8589 Other specified disorders of bone density and structure, multiple sites: Secondary | ICD-10-CM | POA: Diagnosis not present

## 2021-11-22 DIAGNOSIS — Z7952 Long term (current) use of systemic steroids: Secondary | ICD-10-CM | POA: Diagnosis not present

## 2021-12-27 DIAGNOSIS — Z6829 Body mass index (BMI) 29.0-29.9, adult: Secondary | ICD-10-CM | POA: Diagnosis not present

## 2021-12-27 DIAGNOSIS — M353 Polymyalgia rheumatica: Secondary | ICD-10-CM | POA: Diagnosis not present

## 2021-12-27 DIAGNOSIS — E663 Overweight: Secondary | ICD-10-CM | POA: Diagnosis not present

## 2021-12-27 DIAGNOSIS — Z1589 Genetic susceptibility to other disease: Secondary | ICD-10-CM | POA: Diagnosis not present

## 2021-12-27 DIAGNOSIS — M3500 Sicca syndrome, unspecified: Secondary | ICD-10-CM | POA: Diagnosis not present

## 2021-12-27 DIAGNOSIS — M1991 Primary osteoarthritis, unspecified site: Secondary | ICD-10-CM | POA: Diagnosis not present

## 2022-02-11 DIAGNOSIS — M1990 Unspecified osteoarthritis, unspecified site: Secondary | ICD-10-CM | POA: Diagnosis not present

## 2022-02-11 DIAGNOSIS — R202 Paresthesia of skin: Secondary | ICD-10-CM | POA: Diagnosis not present

## 2022-02-11 DIAGNOSIS — I341 Nonrheumatic mitral (valve) prolapse: Secondary | ICD-10-CM | POA: Diagnosis not present

## 2022-02-11 DIAGNOSIS — E785 Hyperlipidemia, unspecified: Secondary | ICD-10-CM | POA: Diagnosis not present

## 2022-02-11 DIAGNOSIS — M8580 Other specified disorders of bone density and structure, unspecified site: Secondary | ICD-10-CM | POA: Diagnosis not present

## 2022-02-11 DIAGNOSIS — R739 Hyperglycemia, unspecified: Secondary | ICD-10-CM | POA: Diagnosis not present

## 2022-02-11 DIAGNOSIS — M353 Polymyalgia rheumatica: Secondary | ICD-10-CM | POA: Diagnosis not present

## 2022-02-11 DIAGNOSIS — E041 Nontoxic single thyroid nodule: Secondary | ICD-10-CM | POA: Diagnosis not present

## 2022-02-11 DIAGNOSIS — G4700 Insomnia, unspecified: Secondary | ICD-10-CM | POA: Diagnosis not present

## 2022-02-11 DIAGNOSIS — I10 Essential (primary) hypertension: Secondary | ICD-10-CM | POA: Diagnosis not present

## 2022-02-11 DIAGNOSIS — R5383 Other fatigue: Secondary | ICD-10-CM | POA: Diagnosis not present

## 2022-02-11 DIAGNOSIS — N1831 Chronic kidney disease, stage 3a: Secondary | ICD-10-CM | POA: Diagnosis not present

## 2022-04-01 DIAGNOSIS — Z6829 Body mass index (BMI) 29.0-29.9, adult: Secondary | ICD-10-CM | POA: Diagnosis not present

## 2022-04-01 DIAGNOSIS — Z1589 Genetic susceptibility to other disease: Secondary | ICD-10-CM | POA: Diagnosis not present

## 2022-04-01 DIAGNOSIS — G4700 Insomnia, unspecified: Secondary | ICD-10-CM | POA: Diagnosis not present

## 2022-04-01 DIAGNOSIS — M1991 Primary osteoarthritis, unspecified site: Secondary | ICD-10-CM | POA: Diagnosis not present

## 2022-04-01 DIAGNOSIS — M79642 Pain in left hand: Secondary | ICD-10-CM | POA: Diagnosis not present

## 2022-04-01 DIAGNOSIS — M353 Polymyalgia rheumatica: Secondary | ICD-10-CM | POA: Diagnosis not present

## 2022-04-01 DIAGNOSIS — E663 Overweight: Secondary | ICD-10-CM | POA: Diagnosis not present

## 2022-04-01 DIAGNOSIS — M79641 Pain in right hand: Secondary | ICD-10-CM | POA: Diagnosis not present

## 2022-04-12 DIAGNOSIS — M1611 Unilateral primary osteoarthritis, right hip: Secondary | ICD-10-CM | POA: Diagnosis not present

## 2022-04-12 DIAGNOSIS — M25512 Pain in left shoulder: Secondary | ICD-10-CM | POA: Diagnosis not present

## 2022-04-12 DIAGNOSIS — M25511 Pain in right shoulder: Secondary | ICD-10-CM | POA: Diagnosis not present

## 2022-04-29 ENCOUNTER — Other Ambulatory Visit: Payer: Self-pay | Admitting: Orthopedic Surgery

## 2022-05-02 DIAGNOSIS — Z6829 Body mass index (BMI) 29.0-29.9, adult: Secondary | ICD-10-CM | POA: Diagnosis not present

## 2022-05-02 DIAGNOSIS — M1991 Primary osteoarthritis, unspecified site: Secondary | ICD-10-CM | POA: Diagnosis not present

## 2022-05-02 DIAGNOSIS — E663 Overweight: Secondary | ICD-10-CM | POA: Diagnosis not present

## 2022-05-02 DIAGNOSIS — M353 Polymyalgia rheumatica: Secondary | ICD-10-CM | POA: Diagnosis not present

## 2022-05-02 DIAGNOSIS — Z1589 Genetic susceptibility to other disease: Secondary | ICD-10-CM | POA: Diagnosis not present

## 2022-05-02 DIAGNOSIS — M0609 Rheumatoid arthritis without rheumatoid factor, multiple sites: Secondary | ICD-10-CM | POA: Diagnosis not present

## 2022-05-05 DIAGNOSIS — M1611 Unilateral primary osteoarthritis, right hip: Secondary | ICD-10-CM | POA: Diagnosis not present

## 2022-05-05 DIAGNOSIS — M25651 Stiffness of right hip, not elsewhere classified: Secondary | ICD-10-CM | POA: Diagnosis not present

## 2022-05-05 DIAGNOSIS — R262 Difficulty in walking, not elsewhere classified: Secondary | ICD-10-CM | POA: Diagnosis not present

## 2022-05-05 NOTE — Patient Instructions (Addendum)
DUE TO COVID-19 ONLY TWO VISITORS  (aged 81 and older)  ARE ALLOWED TO COME WITH YOU AND STAY IN THE WAITING ROOM ONLY DURING PRE OP AND PROCEDURE.   **NO VISITORS ARE ALLOWED IN THE SHORT STAY AREA OR RECOVERY ROOM!!**  IF YOU WILL BE ADMITTED INTO THE HOSPITAL YOU ARE ALLOWED ONLY FOUR SUPPORT PEOPLE DURING VISITATION HOURS ONLY (7 AM -8PM)   The support person(s) must pass our screening, gel in and out, and wear a mask at all times, including in the patient's room. Patients must also wear a mask when staff or their support person are in the room. Visitors GUEST BADGE MUST BE WORN VISIBLY  One adult visitor may remain with you overnight and MUST be in the room by 8 P.M.     Your procedure is scheduled on:    Report to [facility] Main Entrance    Report to admitting at AM   Call this number if you have problems the morning of surgery [phone]   Do not eat food :After Midnight.   After Midnight you may have the following liquids until ______ AM/ PM DAY OF SURGERY  Water Black Coffee (sugar ok, NO MILK/CREAM OR CREAMERS)  Tea (sugar ok, NO MILK/CREAM OR CREAMERS) regular and decaf                             Plain Jell-O (NO RED)                                           Fruit ices (not with fruit pulp, NO RED)                                     Popsicles (NO RED)                                                                  Juice: apple, WHITE grape, WHITE cranberry Sports drinks like Gatorade (NO RED)    Oral Hygiene is also important to reduce your risk of infection.                                    Remember - BRUSH YOUR TEETH THE MORNING OF SURGERY WITH YOUR REGULAR TOOTHPASTE   Do NOT smoke after Midnight   Take these medicines the morning of surgery with A SIP OF WATER:   DO NOT TAKE ANY ORAL DIABETIC MEDICATIONS DAY OF YOUR SURGERY  Bring CPAP mask and tubing day of surgery.                              You may not have any metal on your body  including hair pins, jewelry, and body piercing             Do not wear make-up, lotions, powders, perfumes/cologne, or deodorant  Do not wear nail polish including gel and S&S,  artificial/acrylic nails, or any other type of covering on natural nails including finger and toenails. If you have artificial nails, gel coating, etc. that needs to be removed by a nail salon please have this removed prior to surgery or surgery may need to be canceled/ delayed if the surgeon/ anesthesia feels like they are unable to be safely monitored.   Do not shave  48 hours prior to surgery.               Men may shave face and neck.   Do not bring valuables to the hospital. [place].   Contacts, dentures or bridgework may not be worn into surgery.   Bring small overnight bag day of surgery.   DO NOT [place]. PHARMACY WILL DISPENSE MEDICATIONS LISTED ON YOUR MEDICATION LIST TO YOU DURING YOUR ADMISSION [place]!    Patients discharged on the day of surgery will not be allowed to drive home.  Someone NEEDS to stay with you for the first 24 hours after anesthesia.   Special Instructions: Bring a copy of your healthcare power of attorney and living will documents         the day of surgery if you haven't scanned them before.              Please read over the following fact sheets you were given: IF YOU HAVE QUESTIONS ABOUT YOUR PRE-OP INSTRUCTIONS PLEASE CALL [phone]     [facility] - Preparing for Surgery Before surgery, you can play an important role.  Because skin is not sterile, your skin needs to be as free of germs as possible.  You can reduce the number of germs on your skin by washing with CHG (chlorahexidine gluconate) soap before surgery.  CHG is an antiseptic cleaner which kills germs and bonds with the skin to continue killing germs even after washing. Please DO NOT use if you have an allergy to CHG or  antibacterial soaps.  If your skin becomes reddened/irritated stop using the CHG and inform your nurse when you arrive at Short Stay. Do not shave (including legs and underarms) for at least 48 hours prior to the first CHG shower.  You may shave your face/neck. Please follow these instructions carefully:  1.  Shower with CHG Soap the night before surgery and the  morning of Surgery.  2.  If you choose to wash your hair, wash your hair first as usual with your  normal  shampoo.  3.  After you shampoo, rinse your hair and body thoroughly to remove the  shampoo.                           4.  Use CHG as you would any other liquid soap.  You can apply chg directly  to the skin and wash                       Gently with a scrungie or clean washcloth.  5.  Apply the CHG Soap to your body ONLY FROM THE NECK DOWN.   Do not use on face/ open                           Wound or open sores. Avoid contact with  eyes, ears mouth and genitals (private parts).                       Wash face,  Genitals (private parts) with your normal soap.             6.  Wash thoroughly, paying special attention to the area where your surgery  will be performed.  7.  Thoroughly rinse your body with warm water from the neck down.  8.  DO NOT shower/wash with your normal soap after using and rinsing off  the CHG Soap.                9.  Pat yourself dry with a clean towel.            10.  Wear clean pajamas.            11.  Place clean sheets on your bed the night of your first shower and do not  sleep with pets. Day of Surgery : Do not apply any lotions/deodorants the morning of surgery.  Please wear clean clothes to the hospital/surgery center.  FAILURE TO FOLLOW THESE INSTRUCTIONS MAY RESULT IN THE CANCELLATION OF YOUR SURGERY PATIENT SIGNATURE_________________________________  NURSE SIGNATURE__________________________________  ________________________________________________________________________

## 2022-05-05 NOTE — Progress Notes (Signed)
Pt. Needs orders for surgery. 

## 2022-05-10 ENCOUNTER — Other Ambulatory Visit: Payer: Self-pay

## 2022-05-10 ENCOUNTER — Encounter (HOSPITAL_COMMUNITY)
Admission: RE | Admit: 2022-05-10 | Discharge: 2022-05-10 | Disposition: A | Discharge status: Home / Self Care | Payer: Medicare Other | Source: Ambulatory Visit | Attending: Orthopedic Surgery | Admitting: Orthopedic Surgery

## 2022-05-10 ENCOUNTER — Encounter (HOSPITAL_COMMUNITY): Payer: Self-pay

## 2022-05-10 VITALS — BP 132/87 | HR 85 | Temp 98.0°F | Ht 67.0 in | Wt 183.0 lb

## 2022-05-10 DIAGNOSIS — I2510 Atherosclerotic heart disease of native coronary artery without angina pectoris: Secondary | ICD-10-CM | POA: Diagnosis not present

## 2022-05-10 DIAGNOSIS — Z01818 Encounter for other preprocedural examination: Secondary | ICD-10-CM | POA: Diagnosis not present

## 2022-05-10 HISTORY — DX: Polymyalgia rheumatica: M35.3

## 2022-05-10 HISTORY — DX: Hypothyroidism, unspecified: E03.9

## 2022-05-10 LAB — CBC
HCT: 41.2 % (ref 36.0–46.0)
Hemoglobin: 13.1 g/dL (ref 12.0–15.0)
MCH: 29 pg (ref 26.0–34.0)
MCHC: 31.8 g/dL (ref 30.0–36.0)
MCV: 91.2 fL (ref 80.0–100.0)
Platelets: 260 10*3/uL (ref 150–400)
RBC: 4.52 MIL/uL (ref 3.87–5.11)
RDW: 13.2 % (ref 11.5–15.5)
WBC: 6.5 10*3/uL (ref 4.0–10.5)
nRBC: 0 % (ref 0.0–0.2)

## 2022-05-10 LAB — BASIC METABOLIC PANEL
Anion gap: 8 (ref 5–15)
BUN: 21 mg/dL (ref 8–23)
CO2: 25 mmol/L (ref 22–32)
Calcium: 9.4 mg/dL (ref 8.9–10.3)
Chloride: 108 mmol/L (ref 98–111)
Creatinine, Ser: 0.91 mg/dL (ref 0.44–1.00)
GFR, Estimated: >60 mL/min (ref >60)
Glucose, Bld: 123 mg/dL — ABNORMAL HIGH (ref 70–99)
Potassium: 5 mmol/L (ref 3.5–5.1)
Sodium: 141 mmol/L (ref 135–145)

## 2022-05-10 LAB — SURGICAL PCR SCREEN
MRSA, PCR: NEGATIVE
Staphylococcus aureus: NEGATIVE

## 2022-05-10 NOTE — Progress Notes (Signed)
For Short Stay: [place] appointment date: Date of COVID positive in last 90 days:  Bowel Prep reminder:   For Anesthesia: PCP - Dr. Bryna Colander Cardiologist - N/A  Chest x-ray -  EKG -  Stress Test -  ECHO -  Cardiac Cath -  Pacemaker/ICD device last checked: Pacemaker orders received: Device Rep notified:  Spinal Cord Stimulator:  Sleep Study -  CPAP -   Fasting Blood Sugar -  Checks Blood Sugar _____ times a day Date and result of last Hgb A1c-  Blood Thinner Instructions: Aspirin Instructions: Last Dose:  Activity level: Can go up a flight of stairs and activities of daily living without stopping and without chest pain and/or shortness of breath   Able to exercise without chest pain and/or shortness of breath   Unable to go up a flight of stairs without chest pain and/or shortness of breath     Anesthesia review: Hx: HTN  Patient denies shortness of breath, fever, cough and chest pain at PAT appointment   Patient verbalized understanding of instructions that were given to them at the PAT appointment. Patient was also instructed that they will need to review over the PAT instructions again at home before surgery.

## 2022-05-11 NOTE — Care Plan (Signed)
Ortho Bundle Case Management Note  Patient Details  Name: Stephanie Beasley MRN: [id number] Date of Birth: March 30, 1941  Met with patient and husband in the office prior to surgery. SHe will discharge to home with his assistance. Has equipment at home. OPPT set up with [place]. Patient and MD in agreement with plan. CHoice offered                     DME Arranged:    DME Agency:     HH Arranged:    HH Agency:     Additional Comments: Please contact me with any questions of if this plan should need to change.  Ladell Heads,  [place] Specialist  [phone] 05/11/2022, 2:21 PM

## 2022-05-18 DIAGNOSIS — H04123 Dry eye syndrome of bilateral lacrimal glands: Secondary | ICD-10-CM | POA: Diagnosis not present

## 2022-05-20 ENCOUNTER — Encounter (HOSPITAL_COMMUNITY)
Admission: RE | Disposition: A | Discharge status: Home / Self Care | Payer: Self-pay | Source: Ambulatory Visit | Attending: Orthopedic Surgery

## 2022-05-20 ENCOUNTER — Encounter (HOSPITAL_COMMUNITY): Payer: Self-pay | Admitting: Orthopedic Surgery

## 2022-05-20 ENCOUNTER — Ambulatory Visit (HOSPITAL_COMMUNITY): Payer: Medicare Other | Admitting: Certified Registered"

## 2022-05-20 ENCOUNTER — Ambulatory Visit (HOSPITAL_COMMUNITY): Payer: Medicare Other

## 2022-05-20 ENCOUNTER — Ambulatory Visit (HOSPITAL_BASED_OUTPATIENT_CLINIC_OR_DEPARTMENT_OTHER): Payer: Medicare Other | Admitting: Certified Registered"

## 2022-05-20 ENCOUNTER — Ambulatory Visit (HOSPITAL_COMMUNITY)
Admission: RE | Admit: 2022-05-20 | Discharge: 2022-05-20 | Disposition: A | Discharge status: Home / Self Care | Payer: Medicare Other | Source: Ambulatory Visit | Attending: Orthopedic Surgery | Admitting: Orthopedic Surgery

## 2022-05-20 ENCOUNTER — Other Ambulatory Visit: Payer: Self-pay

## 2022-05-20 DIAGNOSIS — M1611 Unilateral primary osteoarthritis, right hip: Secondary | ICD-10-CM | POA: Diagnosis present

## 2022-05-20 DIAGNOSIS — Z79899 Other long term (current) drug therapy: Secondary | ICD-10-CM | POA: Diagnosis not present

## 2022-05-20 DIAGNOSIS — Z471 Aftercare following joint replacement surgery: Secondary | ICD-10-CM | POA: Diagnosis not present

## 2022-05-20 DIAGNOSIS — K219 Gastro-esophageal reflux disease without esophagitis: Secondary | ICD-10-CM | POA: Insufficient documentation

## 2022-05-20 DIAGNOSIS — I2510 Atherosclerotic heart disease of native coronary artery without angina pectoris: Secondary | ICD-10-CM

## 2022-05-20 DIAGNOSIS — I10 Essential (primary) hypertension: Secondary | ICD-10-CM | POA: Insufficient documentation

## 2022-05-20 DIAGNOSIS — F419 Anxiety disorder, unspecified: Secondary | ICD-10-CM | POA: Diagnosis not present

## 2022-05-20 DIAGNOSIS — Z96641 Presence of right artificial hip joint: Secondary | ICD-10-CM | POA: Diagnosis not present

## 2022-05-20 DIAGNOSIS — M25551 Pain in right hip: Secondary | ICD-10-CM | POA: Diagnosis present

## 2022-05-20 HISTORY — PX: TOTAL HIP ARTHROPLASTY: SHX124

## 2022-05-20 LAB — TYPE AND SCREEN
ABO/RH(D): O POS
Antibody Screen: NEGATIVE

## 2022-05-20 LAB — ABO/RH: ABO/RH(D): O POS

## 2022-05-20 SURGERY — ARTHROPLASTY, HIP, TOTAL, ANTERIOR APPROACH
Anesthesia: Monitor Anesthesia Care | Site: Hip | Laterality: Right

## 2022-05-20 MED ORDER — BUPIVACAINE IN DEXTROSE 0.75-8.25 % IT SOLN
INTRATHECAL | Status: DC | PRN
  Administered 2022-05-20: 1.8 mL via INTRATHECAL

## 2022-05-20 MED ORDER — SODIUM CHLORIDE 0.9 % IR SOLN
Status: DC | PRN
  Administered 2022-05-20: 1000 mL

## 2022-05-20 MED ORDER — BUPIVACAINE LIPOSOME 1.3 % IJ SUSP
INTRAMUSCULAR | Status: AC
  Filled 2022-05-20: qty 10

## 2022-05-20 MED ORDER — EPHEDRINE 5 MG/ML INJ
INTRAVENOUS | Status: AC
  Filled 2022-05-20: qty 10

## 2022-05-20 MED ORDER — LACTATED RINGERS IV SOLN: INTRAVENOUS | Status: DC

## 2022-05-20 MED ORDER — ONDANSETRON HCL 4 MG/2ML IJ SOLN
INTRAMUSCULAR | Status: DC | PRN
  Administered 2022-05-20: 4 mg via INTRAVENOUS

## 2022-05-20 MED ORDER — ORAL CARE MOUTH RINSE: 15.0000 mL | Freq: Once | OROMUCOSAL | Status: AC

## 2022-05-20 MED ORDER — FENTANYL CITRATE (PF) 100 MCG/2ML IJ SOLN
INTRAMUSCULAR | Status: DC | PRN
  Administered 2022-05-20: 50 ug via INTRAVENOUS

## 2022-05-20 MED ORDER — CHLORHEXIDINE GLUCONATE 0.12 % MT SOLN
15.0000 mL | Freq: Once | OROMUCOSAL | Status: AC
  Administered 2022-05-20: 15 mL via OROMUCOSAL

## 2022-05-20 MED ORDER — BUPIVACAINE-EPINEPHRINE (PF) 0.25% -1:200000 IJ SOLN
INTRAMUSCULAR | Status: AC
  Filled 2022-05-20: qty 30

## 2022-05-20 MED ORDER — TIZANIDINE HCL 2 MG PO TABS: 2.0000 mg | ORAL_TABLET | Freq: Three times a day (TID) | ORAL | 0 refills | Status: DC | PRN

## 2022-05-20 MED ORDER — POVIDONE-IODINE 10 % EX SWAB
2.0000 | Freq: Once | CUTANEOUS | Status: AC
  Administered 2022-05-20: 2 via TOPICAL

## 2022-05-20 MED ORDER — LIDOCAINE 2% (20 MG/ML) 5 ML SYRINGE
INTRAMUSCULAR | Status: DC | PRN
  Administered 2022-05-20 (×2): 40 mg via INTRAVENOUS

## 2022-05-20 MED ORDER — CEFAZOLIN SODIUM-DEXTROSE 2-4 GM/100ML-% IV SOLN
INTRAVENOUS | Status: AC
  Filled 2022-05-20: qty 100

## 2022-05-20 MED ORDER — DEXAMETHASONE SODIUM PHOSPHATE 10 MG/ML IJ SOLN
INTRAMUSCULAR | Status: AC
  Filled 2022-05-20: qty 1

## 2022-05-20 MED ORDER — LACTATED RINGERS IV BOLUS
250.0000 mL | Freq: Once | INTRAVENOUS | Status: AC
  Administered 2022-05-20: 250 mL via INTRAVENOUS

## 2022-05-20 MED ORDER — WATER FOR IRRIGATION, STERILE IR SOLN
Status: DC | PRN
  Administered 2022-05-20: 2000 mL

## 2022-05-20 MED ORDER — DEXAMETHASONE SODIUM PHOSPHATE 10 MG/ML IJ SOLN
INTRAMUSCULAR | Status: DC | PRN
  Administered 2022-05-20: 4 mg via INTRAVENOUS

## 2022-05-20 MED ORDER — BUPIVACAINE LIPOSOME 1.3 % IJ SUSP
INTRAMUSCULAR | Status: DC | PRN
  Administered 2022-05-20: 10 mL

## 2022-05-20 MED ORDER — TRANEXAMIC ACID-NACL 1000-0.7 MG/100ML-% IV SOLN
INTRAVENOUS | Status: AC
  Administered 2022-05-20: 1000 mg via INTRAVENOUS
  Filled 2022-05-20: qty 100

## 2022-05-20 MED ORDER — EPHEDRINE SULFATE-NACL 50-0.9 MG/10ML-% IV SOSY
PREFILLED_SYRINGE | INTRAVENOUS | Status: DC | PRN
  Administered 2022-05-20: 10 mg via INTRAVENOUS
  Administered 2022-05-20 (×6): 5 mg via INTRAVENOUS

## 2022-05-20 MED ORDER — DOCUSATE SODIUM 100 MG PO CAPS: 100.0000 mg | ORAL_CAPSULE | Freq: Two times a day (BID) | ORAL | 0 refills | Status: DC

## 2022-05-20 MED ORDER — ONDANSETRON HCL 4 MG/2ML IJ SOLN
INTRAMUSCULAR | Status: AC
  Filled 2022-05-20: qty 2

## 2022-05-20 MED ORDER — BUPIVACAINE-EPINEPHRINE 0.25% -1:200000 IJ SOLN
INTRAMUSCULAR | Status: DC | PRN
  Administered 2022-05-20: 30 mL

## 2022-05-20 MED ORDER — PHENYLEPHRINE HCL-NACL 20-0.9 MG/250ML-% IV SOLN
INTRAVENOUS | Status: DC | PRN
  Administered 2022-05-20: 20 ug/min via INTRAVENOUS

## 2022-05-20 MED ORDER — METHOCARBAMOL 500 MG PO TABS: 500.0000 mg | ORAL_TABLET | Freq: Four times a day (QID) | ORAL | Status: DC | PRN

## 2022-05-20 MED ORDER — TRANEXAMIC ACID-NACL 1000-0.7 MG/100ML-% IV SOLN
INTRAVENOUS | Status: AC
  Filled 2022-05-20: qty 100

## 2022-05-20 MED ORDER — OXYCODONE-ACETAMINOPHEN 5-325 MG PO TABS: 1.0000 | ORAL_TABLET | Freq: Four times a day (QID) | ORAL | 0 refills | Status: DC | PRN

## 2022-05-20 MED ORDER — TRANEXAMIC ACID-NACL 1000-0.7 MG/100ML-% IV SOLN
1000.0000 mg | INTRAVENOUS | Status: AC
  Administered 2022-05-20: 1000 mg via INTRAVENOUS

## 2022-05-20 MED ORDER — OXYCODONE HCL 5 MG/5ML PO SOLN: 5.0000 mg | Freq: Once | ORAL | Status: DC | PRN

## 2022-05-20 MED ORDER — CEFAZOLIN SODIUM-DEXTROSE 2-4 GM/100ML-% IV SOLN
2.0000 g | INTRAVENOUS | Status: AC
  Administered 2022-05-20: 2 g via INTRAVENOUS

## 2022-05-20 MED ORDER — LACTATED RINGERS IV BOLUS
500.0000 mL | Freq: Once | INTRAVENOUS | Status: AC
  Administered 2022-05-20: 500 mL via INTRAVENOUS

## 2022-05-20 MED ORDER — OXYCODONE HCL 5 MG PO TABS: 5.0000 mg | ORAL_TABLET | Freq: Once | ORAL | Status: DC | PRN

## 2022-05-20 MED ORDER — ASPIRIN 325 MG PO TBEC: 325.0000 mg | DELAYED_RELEASE_TABLET | Freq: Two times a day (BID) | ORAL | 0 refills | Status: DC

## 2022-05-20 MED ORDER — PROPOFOL 1000 MG/100ML IV EMUL
INTRAVENOUS | Status: AC
  Filled 2022-05-20: qty 100

## 2022-05-20 MED ORDER — ACETAMINOPHEN 325 MG PO TABS: 325.0000 mg | ORAL_TABLET | ORAL | Status: DC | PRN

## 2022-05-20 MED ORDER — BUPIVACAINE LIPOSOME 1.3 % IJ SUSP: 10.0000 mL | Freq: Once | INTRAMUSCULAR | Status: DC

## 2022-05-20 MED ORDER — PROPOFOL 500 MG/50ML IV EMUL
INTRAVENOUS | Status: DC | PRN
  Administered 2022-05-20: 65 ug/kg/min via INTRAVENOUS

## 2022-05-20 MED ORDER — ONDANSETRON HCL 4 MG/2ML IJ SOLN: 4.0000 mg | Freq: Once | INTRAMUSCULAR | Status: DC | PRN

## 2022-05-20 MED ORDER — ACETAMINOPHEN 160 MG/5ML PO SOLN: 325.0000 mg | ORAL | Status: DC | PRN

## 2022-05-20 MED ORDER — PROPOFOL 10 MG/ML IV BOLUS
INTRAVENOUS | Status: DC | PRN
  Administered 2022-05-20: 30 mg via INTRAVENOUS

## 2022-05-20 MED ORDER — METHOCARBAMOL 500 MG IVPB - SIMPLE MED: 500.0000 mg | Freq: Four times a day (QID) | INTRAVENOUS | Status: DC | PRN

## 2022-05-20 MED ORDER — TRANEXAMIC ACID-NACL 1000-0.7 MG/100ML-% IV SOLN: 1000.0000 mg | Freq: Once | INTRAVENOUS | Status: AC

## 2022-05-20 MED ORDER — FENTANYL CITRATE (PF) 100 MCG/2ML IJ SOLN
INTRAMUSCULAR | Status: AC
  Filled 2022-05-20: qty 2

## 2022-05-20 MED ORDER — FENTANYL CITRATE PF 50 MCG/ML IJ SOSY: 25.0000 ug | PREFILLED_SYRINGE | INTRAMUSCULAR | Status: DC | PRN

## 2022-05-20 MED ORDER — MEPERIDINE HCL 50 MG/ML IJ SOLN: 6.2500 mg | INTRAMUSCULAR | Status: DC | PRN

## 2022-05-20 MED ORDER — CELECOXIB 200 MG PO CAPS: 200.0000 mg | ORAL_CAPSULE | Freq: Every day | ORAL | 0 refills | Status: DC

## 2022-05-20 MED ORDER — CEFAZOLIN SODIUM-DEXTROSE 2-4 GM/100ML-% IV SOLN: 2.0000 g | Freq: Once | INTRAVENOUS | Status: DC

## 2022-05-20 SURGICAL SUPPLY — 46 items
APL SKNCLS STERI-STRIP NONHPOA (GAUZE/BANDAGES/DRESSINGS)
BAG COUNTER SPONGE SURGICOUNT (BAG) IMPLANT
BAG SPEC THK2 15X12 ZIP CLS (MISCELLANEOUS)
BAG SPNG CNTER NS LX DISP (BAG)
BAG ZIPLOCK 12X15 (MISCELLANEOUS) IMPLANT
BENZOIN TINCTURE PRP APPL 2/3 (GAUZE/BANDAGES/DRESSINGS) IMPLANT
BLADE SAW SGTL 18X1.27X75 (BLADE) ×1 IMPLANT
BLADE SURG SZ10 CARB STEEL (BLADE) ×2 IMPLANT
COVER PERINEAL POST (MISCELLANEOUS) ×1 IMPLANT
COVER SURGICAL LIGHT HANDLE (MISCELLANEOUS) ×1 IMPLANT
DRAPE FOOT SWITCH (DRAPES) ×1 IMPLANT
DRAPE STERI IOBAN 125X83 (DRAPES) ×1 IMPLANT
DRAPE U-SHAPE 47X51 STRL (DRAPES) ×2 IMPLANT
DRSG AQUACEL AG ADV 3.5X 6 (GAUZE/BANDAGES/DRESSINGS) ×1 IMPLANT
DURAPREP 26ML APPLICATOR (WOUND CARE) ×1 IMPLANT
ELECT BLADE TIP CTD 4 INCH (ELECTRODE) ×1 IMPLANT
ELECT REM PT RETURN 15FT ADLT (MISCELLANEOUS) ×1 IMPLANT
ELIMINATOR HOLE APEX DEPUY (Hips) IMPLANT
GAUZE XEROFORM 1X8 LF (GAUZE/BANDAGES/DRESSINGS) IMPLANT
GLOVE BIOGEL PI IND STRL 8 (GLOVE) ×2 IMPLANT
GLOVE ECLIPSE 7.5 STRL STRAW (GLOVE) ×2 IMPLANT
GOWN STRL REUS W/ TWL XL LVL3 (GOWN DISPOSABLE) ×2 IMPLANT
GOWN STRL REUS W/TWL XL LVL3 (GOWN DISPOSABLE) ×2
HEAD FEM STD 32X+1 STRL (Hips) IMPLANT
HOLDER FOLEY CATH W/STRAP (MISCELLANEOUS) ×1 IMPLANT
HOOD PEEL AWAY FLYTE STAYCOOL (MISCELLANEOUS) ×2 IMPLANT
KIT TURNOVER KIT A (KITS) IMPLANT
LINER ACETABULAR 32X50 (Liner) IMPLANT
NEEDLE HYPO 22GX1.5 SAFETY (NEEDLE) ×1 IMPLANT
PACK ANTERIOR HIP CUSTOM (KITS) ×1 IMPLANT
PENCIL SMOKE EVACUATOR (MISCELLANEOUS) IMPLANT
PIN SECT CUP 50MM (Hips) IMPLANT
SPIKE FLUID TRANSFER (MISCELLANEOUS) ×1 IMPLANT
STAPLER VISISTAT 35W (STAPLE) IMPLANT
STEM FEM ACTIS STD SZ4 (Stem) IMPLANT
STRIP CLOSURE SKIN 1/2X4 (GAUZE/BANDAGES/DRESSINGS) IMPLANT
SUT ETHIBOND NAB CT1 #1 30IN (SUTURE) ×2 IMPLANT
SUT MNCRL AB 3-0 PS2 18 (SUTURE) IMPLANT
SUT VIC AB 0 CT1 36 (SUTURE) ×1 IMPLANT
SUT VIC AB 1 CT1 36 (SUTURE) ×1 IMPLANT
SUT VIC AB 2-0 CT1 27 (SUTURE) ×2
SUT VIC AB 2-0 CT1 TAPERPNT 27 (SUTURE) ×1 IMPLANT
SUT VIC AB 3-0 CT1 27 (SUTURE)
SUT VIC AB 3-0 CT1 TAPERPNT 27 (SUTURE) IMPLANT
TAPE STRIPS DRAPE STRL (GAUZE/BANDAGES/DRESSINGS) IMPLANT
TRAY FOLEY MTR SLVR 16FR STAT (SET/KITS/TRAYS/PACK) ×1 IMPLANT

## 2022-05-20 NOTE — Anesthesia Preprocedure Evaluation (Signed)
Anesthesia Evaluation  Patient identified by MRN, date of birth, ID band Patient awake    Reviewed: Allergy & Precautions, H&P , NPO status , Patient's Chart, lab work & pertinent test results, reviewed documented beta blocker date and time   Airway Mallampati: II  TM Distance: >3 FB Neck ROM: full    Dental no notable dental hx.    Pulmonary neg pulmonary ROS,    Pulmonary exam normal breath sounds clear to auscultation       Cardiovascular Exercise Tolerance: Good hypertension, Pt. on medications  Rhythm:regular Rate:Normal     Neuro/Psych Anxiety negative neurological ROS     GI/Hepatic Neg liver ROS, GERD  Medicated,  Endo/Other  negative endocrine ROS  Renal/GU negative Renal ROS  negative genitourinary   Musculoskeletal negative musculoskeletal ROS (+) Arthritis , Osteoarthritis,    Abdominal   Peds  Hematology negative hematology ROS (+)   Anesthesia Other Findings   Reproductive/Obstetrics negative OB ROS                             Anesthesia Physical  Anesthesia Plan  ASA: II  Anesthesia Plan: Spinal and MAC   Post-op Pain Management:    Induction: Intravenous  PONV Risk Score and Plan: 3 and Treatment may vary due to age or medical condition and Propofol infusion  Airway Management Planned: Natural Airway and Simple Face Mask  Additional Equipment:   Intra-op Plan:   Post-operative Plan:   Informed Consent: I have reviewed the patients History and Physical, chart, labs and discussed the procedure including the risks, benefits and alternatives for the proposed anesthesia with the patient or authorized representative who has indicated his/her understanding and acceptance.     Dental Advisory Given  Plan Discussed with: CRNA, Surgeon and Anesthesiologist  Anesthesia Plan Comments: (  )        Anesthesia Quick Evaluation

## 2022-05-20 NOTE — Evaluation (Signed)
Physical Therapy Evaluation Patient Details Name: Stephanie Beasley MRN: [id number] DOB: 1941-02-01 Today's Date: 05/20/2022  History of Present Illness  Pt is an 81yo female presenting s/p R-THA, AA on 05/20/22. PMH: GERD, HLD, HTN, hypothyroidism, polymyalgia rheumatica,  b/l RCR, b/l TKA.  Clinical Impression  Stephanie Beasley is a 81 y.o. female POD 0 s/p R-THA,AA. Patient reports modified independence with SPC for mobility at baseline. Patient is now limited by functional impairments (see PT problem list below) and requires min guard for transfers and gait with RW. Patient was able to ambulate 50 feet with RW and min guard and cues for safe walker management. Patient educated on safe sequencing for stair mobility and verbalized safe guarding position for people assisting with mobility. Patient instructed in exercises to facilitate ROM and circulation. Patient will benefit from continued skilled PT interventions to address impairments and progress towards PLOF. Patient has met mobility goals at adequate level for discharge home; will continue to follow if pt continues acute stay to progress towards Mod I goals.       Recommendations for follow up therapy are one component of a multi-disciplinary discharge planning process, led by the attending physician.  Recommendations may be updated based on patient status, additional functional criteria and insurance authorization.  Follow Up Recommendations Follow physician's recommendations for discharge plan and follow up therapies      Assistance Recommended at Discharge Intermittent Supervision/Assistance  Patient can return home with the following  A little help with walking and/or transfers;A little help with bathing/dressing/bathroom;Assistance with cooking/housework;Assist for transportation;Help with stairs or ramp for entrance    Equipment Recommendations None recommended by PT  Recommendations for Other Services       Functional Status Assessment  Patient has had a recent decline in their functional status and demonstrates the ability to make significant improvements in function in a reasonable and predictable amount of time.     Precautions / Restrictions Precautions Precautions: Fall Restrictions Weight Bearing Restrictions: No Other Position/Activity Restrictions: WBAT      Mobility  Bed Mobility Overal bed mobility: Needs Assistance Bed Mobility: Supine to Sit     Supine to sit: Min guard     General bed mobility comments: Min guard for safety only, no physical assist required    Transfers Overall transfer level: Needs assistance Equipment used: Rolling walker (2 wheels) Transfers: Sit to/from Stand Sit to Stand: Min guard, From elevated surface           General transfer comment: Min guard for safety only, no physical assist required from elevated surface    Ambulation/Gait Ambulation/Gait assistance: Min guard Gait Distance (Feet): 50 Feet Assistive device: Rolling walker (2 wheels) Gait Pattern/deviations: Step-to pattern Gait velocity: decreased     General Gait Details: Min guard for safety only, no physical assist required, pt ambulated 56f with RW, no overt LOB noted.  Stairs Stairs: Yes Stairs assistance: Min guard Stair Management: Two rails, One rail Right, Step to pattern, Forwards, Sideways Number of Stairs: 4 General stair comments: Pt completed stair mobility x2, demonstrated safe technique with min guard and minimal cuing for sequencing.  Wheelchair Mobility    Modified Rankin (Stroke Patients Only)       Balance Overall balance assessment: Needs assistance Sitting-balance support: Feet supported, No upper extremity supported Sitting balance-Leahy Scale: Good     Standing balance support: Reliant on assistive device for balance, During functional activity, Bilateral upper extremity supported Standing balance-Leahy Scale: Poor  Pertinent Vitals/Pain Pain Assessment Pain Assessment: No/denies pain    Home Living Family/patient expects to be discharged to:: Private residence Living Arrangements: Spouse/significant other Available Help at Discharge: Family;Available 24 hours/day Type of Home: House Home Access: Stairs to enter Entrance Stairs-Rails: Right;Left;Can reach both Entrance Stairs-Number of Steps: 4   Home Layout: One level Home Equipment: Rolling Walker (2 wheels);Grab bars - tub/shower;Grab bars - toilet;Wheelchair - manual;Hospital bed;BSC/3in1;Cane - single point      Prior Function Prior Level of Function : Independent/Modified Independent;Driving             Mobility Comments: SPC for community mobility, walking on uneven surfaces ADLs Comments: ind     Hand Dominance        Extremity/Trunk Assessment   Upper Extremity Assessment Upper Extremity Assessment: Overall WFL for tasks assessed    Lower Extremity Assessment Lower Extremity Assessment: RLE deficits/detail;LLE deficits/detail RLE Deficits / Details: MMT ank DF/PF 5/5 RLE Sensation: WNL LLE Deficits / Details: MMT ank DF/PF 5/5 LLE Sensation: WNL    Cervical / Trunk Assessment Cervical / Trunk Assessment: Kyphotic  Communication   Communication: No difficulties  Cognition Arousal/Alertness: Awake/alert Behavior During Therapy: WFL for tasks assessed/performed Overall Cognitive Status: Within Functional Limits for tasks assessed                                          General Comments      Exercises Total Joint Exercises Ankle Circles/Pumps: AROM, 10 reps, Both Quad Sets: AROM, Right, Other reps (comment) (2) Short Arc Quad: AROM, Right, Other reps (comment) (2) Heel Slides: AROM, Right, Other reps (comment) (3) Hip ABduction/ADduction: AROM, Right, 5 reps   Assessment/Plan    PT Assessment Patient needs continued PT services  PT Problem List Decreased strength;Decreased range  of motion;Decreased activity tolerance;Decreased balance;Decreased mobility;Decreased coordination;Pain       PT Treatment Interventions DME instruction;Gait training;Stair training;Functional mobility training;Therapeutic activities;Therapeutic exercise;Balance training;Neuromuscular re-education;Patient/family education    PT Goals (Current goals can be found in the Care Plan section)  Acute Rehab PT Goals Patient Stated Goal: Walk without pain PT Goal Formulation: With patient Time For Goal Achievement: 05/27/22 Potential to Achieve Goals: Good    Frequency 7X/week     Co-evaluation               AM-PAC PT "6 Clicks" Mobility  Outcome Measure Help needed turning from your back to your side while in a flat bed without using bedrails?: A Little Help needed moving from lying on your back to sitting on the side of a flat bed without using bedrails?: A Little Help needed moving to and from a bed to a chair (including a wheelchair)?: A Little Help needed standing up from a chair using your arms (e.g., wheelchair or bedside chair)?: A Little Help needed to walk in hospital room?: A Little Help needed climbing 3-5 steps with a railing? : A Little 6 Click Score: 18    End of Session Equipment Utilized During Treatment: Gait belt Activity Tolerance: Patient tolerated treatment well;No increased pain Patient left: in chair;with call bell/phone within reach;with nursing/sitter in room;with family/visitor present Nurse Communication: Mobility status PT Visit Diagnosis: Difficulty in walking, not elsewhere classified (R26.2)    Time: 4193-7902 PT Time Calculation (min) (ACUTE ONLY): 34 min   Charges:   PT Evaluation $PT Eval Low Complexity: 1 Low PT Treatments $Gait Training:  8-22 mins        Coolidge Breeze, PT, DPT [facility] Rehabilitation Department Office: [phone]  Coolidge Breeze 05/20/2022, 1:43 PM

## 2022-05-20 NOTE — Op Note (Signed)
PATIENT ID:      Stephanie Beasley  MRN:     [id number] DOB/AGE:    [birth year] / 81 y.o.       OPERATIVE REPORT    DATE OF PROCEDURE:  05/20/2022       PREOPERATIVE DIAGNOSIS:  RIGHT HIP DEGENERATIVE JOINT DISEASE                                                       Estimated body mass index is 28.66 kg/m as calculated from the following:   Height as of 05/10/22: '5\' 7"'$  (1.702 m).   Weight as of 05/10/22: 83 kg.     POSTOPERATIVE DIAGNOSIS:  RIGHT HIP DEGENERATIVE JOINT DISEASE                                                           PROCEDURE:  1. right total hip arthroplasty using a 52 mm DePuy Pinnacle  Cup, [employer],  neutral liner, a +1.5 32 mm ceramic head,  and a #4  Actis stem, 2.interpretation of multiple intraoperative fluoroscopic images   SURGEON: Alta Corning    ASSISTANT:   Nehemiah Massed PA-C  (present throughout entire procedure and necessary for timely completion of the procedure)  ANESTHESIA: spinal  BLOOD LOSS: 300cc Tranexamic Acid: 1 gram IV DRAINS: None COMPLICATIONS: None    [place] with end-stage arthritis of the right hip.  X-rays show bone-on-bone arthritic changes. Despite conservative measures with observation, anti-inflammatory medicine, narcotics, use of a cane, has severe unremitting pain and can ambulate only less than 1 block before resting.  Patient desires elective right total hip arthroplasty to decrease pain and increase function. The risks, benefits, and alternatives were discussed at length including but not limited to the risks of infection, bleeding, nerve injury, stiffness, blood clots, the need for revision surgery, cardiopulmonary complications, among others, and they were willing to proceed.Benefits have been discussed. Questions answered.     PROCEDURE IN DETAIL: The patient was identified by armband,  received preoperative IV antibiotics in the holding area at [facility], taken to the  operating room , appropriate anesthetic monitors  were attached and spinal anesthesia was induced.  The patient was placed onto the hot bed and all bony prominences were well-padded.The right hip was prepped and draped for an anterior approach to the hip.  An incision was made and the subcutaneous dissection was down to the level of the tensor fascia.  The fascia was opened and finger dissected.  The bleeders coming across the anterior portion of the hip were identified and cauterized. Retractors were put in place above and below the femoral neck.  The capsule was opened and tagged and a provisional neck cut was made.  The head was removed and sized on the back table.  The acetabulum was sequentially reamed to a level of 51 mm and a 52 mm porous-coated pinnacle cup was hammered into place with 45 of lateral opening and 30 of anteversion.fluoroscopy was used to ensure this position of the cup.  Attention was turned towards the femur where the leg was actually rotated,  extended, and adduction did.  The femur was sequentially broached until a size of 4 broach gave a perfect fit and fill.at this point a  1.5 mm delta ceramic hip ball was placed and the hip reduced.  Fluoroscopic images were taken to assess the leg length, fit and fill of the stem, and cup position.  We were happy with the construct at this point.  The 4 broach was removed and a final Actis stem with standard offset  and a 1.5 mm ceramic hip ball was placed and reduced.  Final images were taken to make certain there were happy with the position at this point.   The capsule was closed with #1 Vicryl suture.  The tensor fascia was closed with 0 Vicryl suture.  The skin was then closed with combination of 0 and 2-0 Vicryl suture.  The top layer was with 3-0 Monocryl suture.  Benzoin and Steri-Strips were applied  and a sterile compressive dressing was applied and the patient taken to recovery room she noted be in satisfactory condition.  Past  medical Motion for the procedure was approximately 300 cc.  Of Nehemiah Massed was with the entire case and assisted by retraction of tissues, manipulation of the leg, and closing the minimize or time.    Alta Corning 02/28/2021, 6:15 PM  Alta Corning 05/20/2022, 11:00 AM

## 2022-05-20 NOTE — Anesthesia Procedure Notes (Signed)
Procedure Name: MAC Date/Time: 05/20/2022 9:40 AM  Performed by: Eben Burow, CRNAPre-anesthesia Checklist: Patient identified, Emergency Drugs available, Suction available, Patient being monitored and Timeout performed Oxygen Delivery Method: Simple face mask Placement Confirmation: positive ETCO2

## 2022-05-20 NOTE — Anesthesia Procedure Notes (Addendum)
Spinal  Patient location during procedure: OR Start time: 05/20/2022 9:50 AM Reason for block: surgical anesthesia Staffing Performed: anesthesiologist  Anesthesiologist: Janeece Riggers, MD Resident/CRNA: Eben Burow, CRNA Performed by: Eben Burow, CRNA Authorized by: Janeece Riggers, MD   Preanesthetic Checklist Completed: patient identified, IV checked, site marked, risks and benefits discussed, surgical consent, monitors and equipment checked, pre-op evaluation and timeout performed Spinal Block Patient position: sitting Prep: DuraPrep and site prepped and draped Patient monitoring: continuous pulse ox, blood pressure, cardiac monitor and heart rate Approach: right paramedian Location: L3-4 Injection technique: single-shot Needle Needle type: Pencan  Needle gauge: 24 G Needle length: 10 cm Assessment Events: CSF return Additional Notes Pt placed in sitting position, spinal kit expiration date checked and verified, + CSF, - heme, pt tolerated well. Dr Ambrose Pancoast placed SAB after one attempt by Jefm Miles, CRNA. Dr Ambrose Pancoast present and supervising throughout. Adequate sensory level.

## 2022-05-20 NOTE — H&P (Signed)
TOTAL HIP ADMISSION H&P  Patient is admitted for right total hip arthroplasty.  Subjective:  Chief Complaint: right hip pain  HPI: Stephanie Beasley, 81 y.o. female, has a history of pain and functional disability in the right hip(s) due to arthritis and patient has failed non-surgical conservative treatments for greater than 12 weeks to include NSAID's and/or analgesics, flexibility and strengthening excercises, and activity modification.  Onset of symptoms was gradual starting 5 years ago with gradually worsening course since that time.The patient noted no past surgery on the right hip(s).  Patient currently rates pain in the right hip at 9 out of 10 with activity. Patient has night pain, worsening of pain with activity and weight bearing, trendelenberg gait, pain that interfers with activities of daily living, crepitus, and joint swelling. Patient has evidence of subchondral cysts, subchondral sclerosis, and joint space narrowing by imaging studies. This condition presents safety issues increasing the risk of falls. This patient has had  failure of all reasonable conservative care .  There is no current active infection.  Patient Active Problem List   Diagnosis Date Noted   History of lobectomy of thyroid 10/06/2021   Neoplasm of uncertain behavior of thyroid gland 09/08/2021   Thyroid nodule, uninodular 09/08/2021   Solitary nodule of right lobe of thyroid 08/18/2021   Primary osteoarthritis of right knee 08/06/2018   Primary osteoarthritis of left knee 01/05/2018   Pseudophakia of both eyes 01/30/2015   Cataract extraction status of eye, right 01/30/2015   ABMD (anterior basement membrane dystrophy) 12/09/2014   Cortical age-related cataract of right eye 12/09/2014   Posterior subcapsular polar age-related cataract of right eye 12/09/2014   Past Medical History:  Diagnosis Date   Anal fissure    Cataract    surgery to remove in 2016 -bilateral   Diverticulitis of colon    ? 2011    Esophageal stricture    GERD (gastroesophageal reflux disease)    Hyperlipidemia    Hypertension    Hypothyroidism    Insomnia    Osteoarthritis    knees   Osteopenia    Polymyalgia rheumatica (HCC)    Posterior vitreous detachment    Tubular adenoma polyp of rectum    Vitamin B12 deficiency    Vitamin D insufficiency     Past Surgical History:  Procedure Laterality Date   APPENDECTOMY     BREAST CYST ASPIRATION     COLONOSCOPY  02/2011    polyps/tics/hems   eye surgery Bilateral 01/2015   remove cataracts   ROTATOR CUFF REPAIR Bilateral    x 2   TENDON REPAIR Left    achyles tendon   THYROID LOBECTOMY Right 09/10/2021   Procedure: RIGHT THYROID LOBECTOMY;  Surgeon: Armandina Gemma, MD;  Location: [facility];  Service: General;  Laterality: Right;   TOTAL ABDOMINAL HYSTERECTOMY W/ BILATERAL SALPINGOOPHORECTOMY     TOTAL KNEE ARTHROPLASTY Left 01/05/2018   Procedure: LEFT TOTAL KNEE ARTHROPLASTY;  Surgeon: Dorna Leitz, MD;  Location: [facility];  Service: Orthopedics;  Laterality: Left;   TOTAL KNEE ARTHROPLASTY Right 08/06/2018   Procedure: TOTAL KNEE ARTHROPLASTY;  Surgeon: Dorna Leitz, MD;  Location: [facility];  Service: Orthopedics;  Laterality: Right;   WISDOM TOOTH EXTRACTION      No current facility-administered medications for this encounter.   Allergies  Allergen Reactions   Hydrocodone Other (See Comments)    Causes patient to get the shakes    Social History   Tobacco Use   Smoking status: Never  Smokeless tobacco: Never  Substance Use Topics   Alcohol use: No    Family History  Problem Relation Age of Onset   COPD Mother    Gout Other    Diabetes type II Other    Stroke Other    Breast cancer Other        grandmother   Colon cancer Neg Hx    Colon polyps Neg Hx    Rectal cancer Neg Hx    Stomach cancer Neg Hx      Review of Systems ROS: I have reviewed the patient's review of systems thoroughly and there are no positive responses as relates to the HPI.   Objective:  Physical Exam  Vital signs in last 24 hours:   Well-developed well-nourished patient in no acute distress. Alert and oriented x3 HEENT:within normal limits Cardiac: Regular rate and rhythm Pulmonary: Lungs clear to auscultation Abdomen: Soft and nontender.  Normal active bowel sounds  Musculoskeletal: (Right hip: Painful range of motion.  Limited range of motion.  Pain with internal/external rotation.  Neurovascular intact distally Labs: Recent Results (from the past 2160 hour(s))  Surgical pcr screen     Status: None   Collection Time: 05/10/22  2:34 PM   Specimen: Nasal Mucosa; Nasal Swab  Result Value Ref Range   MRSA, PCR NEGATIVE NEGATIVE   Staphylococcus aureus NEGATIVE NEGATIVE    Comment: (NOTE) The Xpert SA Assay (FDA approved for NASAL specimens in patients 4 years of age and older), is one component of a comprehensive surveillance program. It is not intended to diagnose infection nor to guide or monitor treatment. Performed at [facility], [place] [address] [place], [place] [address]   Basic metabolic panel per protocol     Status: Abnormal   Collection Time: 05/10/22  2:34 PM  Result Value Ref Range   Sodium 141 135 - 145 mmol/L   Potassium 5.0 3.5 - 5.1 mmol/L   Chloride 108 98 - 111 mmol/L   CO2 25 22 - 32 mmol/L   Glucose, Bld 123 (H) 70 - 99 mg/dL    Comment: Glucose reference range applies only to samples taken after fasting for at least 8 hours.   BUN 21 8 - 23 mg/dL   Creatinine, Ser 0.91 0.44 - 1.00 mg/dL   Calcium 9.4 8.9 - 10.3 mg/dL   GFR, Estimated >60 >60 mL/min    Comment: (NOTE) Calculated using the CKD-EPI Creatinine Equation (2021)    Anion gap 8 5 - 15    Comment: Performed at [facility], [place] [address], [place], [place] [address]  CBC per protocol     Status: None   Collection Time: 05/10/22  2:34 PM  Result Value Ref Range   WBC 6.5 4.0 - 10.5 K/uL   RBC 4.52 3.87 - 5.11 MIL/uL    Hemoglobin 13.1 12.0 - 15.0 g/dL   HCT 41.2 36.0 - 46.0 %   MCV 91.2 80.0 - 100.0 fL   MCH 29.0 26.0 - 34.0 pg   MCHC 31.8 30.0 - 36.0 g/dL   RDW 13.2 11.5 - 15.5 %   Platelets 260 150 - 400 K/uL   nRBC 0.0 0.0 - 0.2 %    Comment: Performed at [facility], [place] [address], [place], [place] [address]  Type and screen [place]     Status: None   Collection Time: 05/10/22  2:34 PM  Result Value Ref Range   ABO/RH(D) O POS  Antibody Screen NEG    Sample Expiration 05/24/2022,2359    Extend sample reason      NO TRANSFUSIONS OR PREGNANCY IN THE PAST 3 MONTHS Performed at [facility], [place] [address], [place], [place] [address]      Estimated body mass index is 28.66 kg/m as calculated from the following:   Height as of 05/10/22: '5\' 7"'$  (1.702 m).   Weight as of 05/10/22: 83 kg.   Imaging Review Plain radiographs demonstrate severe degenerative joint disease of the right hip(s). The bone quality appears to be fair for age and reported activity level.      Assessment/Plan:  End stage arthritis, right hip(s)  The patient history, physical examination, clinical judgement of the provider and imaging studies are consistent with end stage degenerative joint disease of the right hip(s) and total hip arthroplasty is deemed medically necessary. The treatment options including medical management, injection therapy, arthroscopy and arthroplasty were discussed at length. The risks and benefits of total hip arthroplasty were presented and reviewed. The risks due to aseptic loosening, infection, stiffness, dislocation/subluxation,  thromboembolic complications and other imponderables were discussed.  The patient acknowledged the explanation, agreed to proceed with the plan and consent was signed. Patient is being admitted for inpatient treatment for surgery, pain control, PT, OT, prophylactic antibiotics, VTE prophylaxis, progressive  ambulation and ADL's and discharge planning.The patient is planning to be discharged home with home health services

## 2022-05-20 NOTE — Transfer of Care (Signed)
Immediate Anesthesia Transfer of Care Note  Patient: Stephanie Beasley  Procedure(s) Performed: TOTAL HIP ARTHROPLASTY ANTERIOR APPROACH (Right: Hip)  Patient Location: PACU  Anesthesia Type:Spinal  Level of Consciousness: awake, alert  and patient cooperative  Airway & Oxygen Therapy: Patient Spontanous Breathing and Patient connected to face mask oxygen  Post-op Assessment: Report given to RN and Post -op Vital signs reviewed and stable  Post vital signs: Reviewed and stable  Last Vitals:  Vitals Value Taken Time  BP 95/57 05/20/22 1127  Temp    Pulse 100 05/20/22 1129  Resp 19 05/20/22 1129  SpO2 100 % 05/20/22 1129  Vitals shown include unvalidated device data.  Last Pain:  Vitals:   05/20/22 0756  TempSrc:   PainSc: 0-No pain         Complications: No notable events documented.

## 2022-05-20 NOTE — Discharge Instructions (Signed)

## 2022-05-22 ENCOUNTER — Encounter (HOSPITAL_COMMUNITY): Payer: Self-pay | Admitting: Orthopedic Surgery

## 2022-05-23 DIAGNOSIS — R531 Weakness: Secondary | ICD-10-CM | POA: Diagnosis not present

## 2022-05-23 DIAGNOSIS — M25651 Stiffness of right hip, not elsewhere classified: Secondary | ICD-10-CM | POA: Diagnosis not present

## 2022-05-23 DIAGNOSIS — R262 Difficulty in walking, not elsewhere classified: Secondary | ICD-10-CM | POA: Diagnosis not present

## 2022-05-26 DIAGNOSIS — R262 Difficulty in walking, not elsewhere classified: Secondary | ICD-10-CM | POA: Diagnosis not present

## 2022-05-26 DIAGNOSIS — M25651 Stiffness of right hip, not elsewhere classified: Secondary | ICD-10-CM | POA: Diagnosis not present

## 2022-05-26 DIAGNOSIS — R531 Weakness: Secondary | ICD-10-CM | POA: Diagnosis not present

## 2022-05-30 DIAGNOSIS — M25651 Stiffness of right hip, not elsewhere classified: Secondary | ICD-10-CM | POA: Diagnosis not present

## 2022-05-30 DIAGNOSIS — R531 Weakness: Secondary | ICD-10-CM | POA: Diagnosis not present

## 2022-05-30 DIAGNOSIS — R262 Difficulty in walking, not elsewhere classified: Secondary | ICD-10-CM | POA: Diagnosis not present

## 2022-06-01 DIAGNOSIS — R531 Weakness: Secondary | ICD-10-CM | POA: Diagnosis not present

## 2022-06-01 DIAGNOSIS — M25651 Stiffness of right hip, not elsewhere classified: Secondary | ICD-10-CM | POA: Diagnosis not present

## 2022-06-01 DIAGNOSIS — R262 Difficulty in walking, not elsewhere classified: Secondary | ICD-10-CM | POA: Diagnosis not present

## 2022-06-01 NOTE — Anesthesia Postprocedure Evaluation (Signed)
Anesthesia Post Note  Patient: Stephanie Beasley  Procedure(s) Performed: TOTAL HIP ARTHROPLASTY ANTERIOR APPROACH (Right: Hip)     Patient location during evaluation: PACU Anesthesia Type: MAC Level of consciousness: awake and alert Pain management: pain level controlled Vital Signs Assessment: post-procedure vital signs reviewed and stable Respiratory status: spontaneous breathing, nonlabored ventilation, respiratory function stable and patient connected to nasal cannula oxygen Cardiovascular status: stable and blood pressure returned to baseline Postop Assessment: no apparent nausea or vomiting Anesthetic complications: no   No notable events documented.  Last Vitals:  Vitals:   05/20/22 1400 05/20/22 1507  BP: 123/79 125/85  Pulse: 86   Resp: 20   Temp: 36.5 C   SpO2: 96% 96%    Last Pain:  Vitals:   05/20/22 1507  TempSrc:   PainSc: 0-No pain                 Myca Perno

## 2022-06-02 DIAGNOSIS — Z471 Aftercare following joint replacement surgery: Secondary | ICD-10-CM | POA: Diagnosis not present

## 2022-06-02 DIAGNOSIS — Z96641 Presence of right artificial hip joint: Secondary | ICD-10-CM | POA: Diagnosis not present

## 2022-06-03 ENCOUNTER — Encounter (HOSPITAL_COMMUNITY): Payer: Self-pay | Admitting: Orthopedic Surgery

## 2022-06-06 ENCOUNTER — Ambulatory Visit: Payer: Self-pay

## 2022-06-06 DIAGNOSIS — R531 Weakness: Secondary | ICD-10-CM | POA: Diagnosis not present

## 2022-06-06 DIAGNOSIS — M25651 Stiffness of right hip, not elsewhere classified: Secondary | ICD-10-CM | POA: Diagnosis not present

## 2022-06-06 DIAGNOSIS — R262 Difficulty in walking, not elsewhere classified: Secondary | ICD-10-CM | POA: Diagnosis not present

## 2022-06-06 NOTE — Patient Instructions (Signed)
Visit Information  Thank you for taking time to visit with me today. Please don't hesitate to contact me if I can be of assistance to you.   Following are the goals we discussed today:   Goals Addressed             This Visit's Progress    COMPLETED: Care Coordination Activities       Care Coordination Interventions: SDoH screening completed - no acute resource challenges identified at this time Determined the patient does not have concerns with medication costs and/or adherence at this time Performed chart review to note patient participated in an annual wellness visit on 10/11/21 Education provided on the role of the care coordination team - no follow up desired at this time Instructed the patient to contact her primary care provider as needed         If you are experiencing a Mental Health or [place] or need someone to talk to, please call 1-800-273-TALK (toll free, 24 hour hotline)  Patient verbalizes understanding of instructions and care plan provided today and agrees to view in [place]. Active MyChart status and patient understanding of how to access instructions and care plan via MyChart confirmed with patient.     No further follow up required: Please contact your primary care provider as needed  Daneen Schick, Arita Miss, CDP Social Worker, Certified Dementia Practitioner [place] [facility] [phone]

## 2022-06-06 NOTE — Patient Outreach (Signed)
  Care Coordination   Initial Visit Note   06/06/2022 Name: Stephanie Beasley MRN: [id number] DOB: 06/29/1941  Stephanie Beasley is a 81 y.o. year old female who sees Stephanie Baton, MD for primary care. I spoke with  Stephanie Beasley by phone today.  What matters to the patients health and wellness today?  No concerns, doing well at this time    Goals Addressed             This Visit's Progress    COMPLETED: Care Coordination Activities       Care Coordination Interventions: SDoH screening completed - no acute resource challenges identified at this time Determined the patient does not have concerns with medication costs and/or adherence at this time Performed chart review to note patient participated in an annual wellness visit on 10/11/21 Education provided on the role of the care coordination team - no follow up desired at this time Instructed the patient to contact her primary care provider as needed         SDOH assessments and interventions completed:  Yes  SDOH Interventions Today    Flowsheet Row Most Recent Value  SDOH Interventions   Food Insecurity Interventions Intervention Not Indicated  Housing Interventions Intervention Not Indicated  Transportation Interventions Intervention Not Indicated  Utilities Interventions Intervention Not Indicated        Care Coordination Interventions Activated:  Yes  Care Coordination Interventions:  Yes, provided   Follow up plan: No further intervention required.   Encounter Outcome:  Pt. Visit Completed   Stephanie Beasley, BSW, CDP Social Worker, Certified Dementia Practitioner [place]  Care Coordination [phone]

## 2022-06-13 DIAGNOSIS — M25651 Stiffness of right hip, not elsewhere classified: Secondary | ICD-10-CM | POA: Diagnosis not present

## 2022-06-13 DIAGNOSIS — R531 Weakness: Secondary | ICD-10-CM | POA: Diagnosis not present

## 2022-06-13 DIAGNOSIS — R262 Difficulty in walking, not elsewhere classified: Secondary | ICD-10-CM | POA: Diagnosis not present

## 2022-06-16 DIAGNOSIS — R531 Weakness: Secondary | ICD-10-CM | POA: Diagnosis not present

## 2022-06-16 DIAGNOSIS — M25651 Stiffness of right hip, not elsewhere classified: Secondary | ICD-10-CM | POA: Diagnosis not present

## 2022-06-16 DIAGNOSIS — R262 Difficulty in walking, not elsewhere classified: Secondary | ICD-10-CM | POA: Diagnosis not present

## 2022-07-04 DIAGNOSIS — E663 Overweight: Secondary | ICD-10-CM | POA: Diagnosis not present

## 2022-07-04 DIAGNOSIS — Z6828 Body mass index (BMI) 28.0-28.9, adult: Secondary | ICD-10-CM | POA: Diagnosis not present

## 2022-07-04 DIAGNOSIS — M1991 Primary osteoarthritis, unspecified site: Secondary | ICD-10-CM | POA: Diagnosis not present

## 2022-07-04 DIAGNOSIS — Z1589 Genetic susceptibility to other disease: Secondary | ICD-10-CM | POA: Diagnosis not present

## 2022-07-04 DIAGNOSIS — M353 Polymyalgia rheumatica: Secondary | ICD-10-CM | POA: Diagnosis not present

## 2022-07-04 DIAGNOSIS — M0609 Rheumatoid arthritis without rheumatoid factor, multiple sites: Secondary | ICD-10-CM | POA: Diagnosis not present

## 2022-07-05 ENCOUNTER — Ambulatory Visit (INDEPENDENT_AMBULATORY_CARE_PROVIDER_SITE_OTHER): Payer: Medicare Other | Admitting: Podiatry

## 2022-07-05 DIAGNOSIS — L600 Ingrowing nail: Secondary | ICD-10-CM

## 2022-07-05 DIAGNOSIS — M79674 Pain in right toe(s): Secondary | ICD-10-CM | POA: Diagnosis not present

## 2022-07-05 DIAGNOSIS — I2510 Atherosclerotic heart disease of native coronary artery without angina pectoris: Secondary | ICD-10-CM

## 2022-07-05 NOTE — Patient Instructions (Signed)

## 2022-07-05 NOTE — Progress Notes (Signed)
Subjective: Chief Complaint  Patient presents with   Ingrown Toenail    Right foot bilateral sides ingrown toe nail, started 6 months ago, soreness and redness,    81 year old female presents above concerns.  She previously had an avulsion right hallux earlier this year but she has the nails come back and is on both corners of the nail.  Nails tender.  No swelling redness or drainage.   Objective: AAO x3, NAD DP/PT pulses palpable bilaterally, CRT less than 3 seconds Right hallux ingrown on medial and lateral border.  Tenderness to the nail border.  Mild edema but no cellulitis or drainage or pus noted.  No signs of an abscess.  Nail is more hypertopric and yellow discolroation compared to the others.  No pain with calf compression, swelling, warmth, erythema  Assessment: Ingrown toenail right hallux  Plan: -All treatment options discussed with the patient including all alternatives, risks, complications.  -At this time, the patient is requesting partial nail removal with chemical matricectomy to the symptomatic portion of the nail. Risks and complications were discussed with the patient for which they understand and written consent was obtained. Under sterile conditions a total of 3 mL of a mixture of 2% lidocaine plain and 0.5% Marcaine plain was infiltrated in a hallux block fashion. Once anesthetized, the skin was prepped in sterile fashion. A tourniquet was then applied. Next the medial, lateral aspect of hallux nail border was then sharply excised making sure to remove the entire offending nail border. Once the nails were ensured to be removed area was debrided and the underlying skin was intact. There is no purulence identified in the procedure. Next phenol was then applied under standard conditions and copiously irrigated. Silvadene was applied. A dry sterile dressing was applied. After application of the dressing the tourniquet was removed and there is found to be an immediate capillary  refill time to the digit. The patient tolerated the procedure well any complications. Post procedure instructions were discussed the patient for which he verbally understood.  Discussed signs/symptoms of infection and directed to call the office immediately should any occur or go directly to the emergency room. In the meantime, encouraged to call the office with any questions, concerns, changes symptoms. -Once the procedure site heals may need to do treatment for the nail. -Patient encouraged to call the office with any questions, concerns, change in symptoms.   Trula Slade DPM

## 2022-07-06 DIAGNOSIS — Z1152 Encounter for screening for COVID-19: Secondary | ICD-10-CM | POA: Diagnosis not present

## 2022-07-06 DIAGNOSIS — R051 Acute cough: Secondary | ICD-10-CM | POA: Diagnosis not present

## 2022-07-06 DIAGNOSIS — M353 Polymyalgia rheumatica: Secondary | ICD-10-CM | POA: Diagnosis not present

## 2022-07-06 DIAGNOSIS — R5383 Other fatigue: Secondary | ICD-10-CM | POA: Diagnosis not present

## 2022-07-06 DIAGNOSIS — J310 Chronic rhinitis: Secondary | ICD-10-CM | POA: Diagnosis not present

## 2022-07-12 ENCOUNTER — Ambulatory Visit: Payer: BLUE CROSS/BLUE SHIELD | Admitting: Podiatry

## 2022-07-21 ENCOUNTER — Ambulatory Visit (INDEPENDENT_AMBULATORY_CARE_PROVIDER_SITE_OTHER): Payer: Medicare Other | Admitting: Podiatry

## 2022-07-21 DIAGNOSIS — I2510 Atherosclerotic heart disease of native coronary artery without angina pectoris: Secondary | ICD-10-CM | POA: Diagnosis not present

## 2022-07-21 DIAGNOSIS — L539 Erythematous condition, unspecified: Secondary | ICD-10-CM | POA: Diagnosis not present

## 2022-07-21 DIAGNOSIS — L600 Ingrowing nail: Secondary | ICD-10-CM | POA: Diagnosis not present

## 2022-07-21 MED ORDER — CEPHALEXIN 500 MG PO CAPS: 500.0000 mg | ORAL_CAPSULE | Freq: Three times a day (TID) | ORAL | 0 refills | Status: DC

## 2022-07-21 NOTE — Progress Notes (Signed)
Subjective: Chief Complaint  Patient presents with   Ingrown Toenail    Right foot hallux ingrown nail check, TX: soaking and     81 year old female presents the office today with above concerns.  She states it is still sore but she thinks it is getting better. She had some bleeding after the procedure when got home but after a few days it stopped.    Objective: AAO x3, NAD DP/PT pulses palpable bilaterally, CRT less than 3 seconds Status post partial nail avulsion however there is localized erythema on the base of the nail.  There is no drainage or pus.  No ascending cellulitis.  This is where the area is more tender. No pain with calf compression, swelling, warmth, erythema  Assessment: Status post partial nail avulsion with localized erythema  Plan: -All treatment options discussed with the patient including all alternatives, risks, complications.  -Given the localized erythema start cephalexin.  Continue soaking in Epsom salts, antibiotic ointment and a bandage.  Monitoring signs or symptoms of worsening erythema or infection to let me know. -Patient encouraged to call the office with any questions, concerns, change in symptoms.   Return in about 2 weeks (around 08/04/2022), or if symptoms worsen or fail to improve.  Trula Slade DPM

## 2022-09-01 DIAGNOSIS — R051 Acute cough: Secondary | ICD-10-CM | POA: Diagnosis not present

## 2022-09-01 DIAGNOSIS — R0981 Nasal congestion: Secondary | ICD-10-CM | POA: Diagnosis not present

## 2022-09-01 DIAGNOSIS — R5383 Other fatigue: Secondary | ICD-10-CM | POA: Diagnosis not present

## 2022-09-01 DIAGNOSIS — Z1152 Encounter for screening for COVID-19: Secondary | ICD-10-CM | POA: Diagnosis not present

## 2022-09-01 DIAGNOSIS — U071 COVID-19: Secondary | ICD-10-CM | POA: Diagnosis not present

## 2022-09-29 DIAGNOSIS — M545 Low back pain, unspecified: Secondary | ICD-10-CM | POA: Diagnosis not present

## 2022-09-29 DIAGNOSIS — M7061 Trochanteric bursitis, right hip: Secondary | ICD-10-CM | POA: Diagnosis not present

## 2022-10-04 DIAGNOSIS — M353 Polymyalgia rheumatica: Secondary | ICD-10-CM | POA: Diagnosis not present

## 2022-10-04 DIAGNOSIS — M1991 Primary osteoarthritis, unspecified site: Secondary | ICD-10-CM | POA: Diagnosis not present

## 2022-10-04 DIAGNOSIS — M0609 Rheumatoid arthritis without rheumatoid factor, multiple sites: Secondary | ICD-10-CM | POA: Diagnosis not present

## 2022-10-04 DIAGNOSIS — Z1589 Genetic susceptibility to other disease: Secondary | ICD-10-CM | POA: Diagnosis not present

## 2022-10-04 DIAGNOSIS — Z6829 Body mass index (BMI) 29.0-29.9, adult: Secondary | ICD-10-CM | POA: Diagnosis not present

## 2022-10-04 DIAGNOSIS — E663 Overweight: Secondary | ICD-10-CM | POA: Diagnosis not present

## 2022-10-18 DIAGNOSIS — K219 Gastro-esophageal reflux disease without esophagitis: Secondary | ICD-10-CM | POA: Diagnosis not present

## 2022-10-18 DIAGNOSIS — R739 Hyperglycemia, unspecified: Secondary | ICD-10-CM | POA: Diagnosis not present

## 2022-10-18 DIAGNOSIS — I10 Essential (primary) hypertension: Secondary | ICD-10-CM | POA: Diagnosis not present

## 2022-10-18 DIAGNOSIS — R7989 Other specified abnormal findings of blood chemistry: Secondary | ICD-10-CM | POA: Diagnosis not present

## 2022-10-18 DIAGNOSIS — E539 Vitamin B deficiency, unspecified: Secondary | ICD-10-CM | POA: Diagnosis not present

## 2022-10-18 DIAGNOSIS — E785 Hyperlipidemia, unspecified: Secondary | ICD-10-CM | POA: Diagnosis not present

## 2022-10-24 ENCOUNTER — Ambulatory Visit (INDEPENDENT_AMBULATORY_CARE_PROVIDER_SITE_OTHER): Payer: Medicare Other | Admitting: Podiatry

## 2022-10-24 ENCOUNTER — Encounter: Payer: Self-pay | Admitting: Podiatry

## 2022-10-24 DIAGNOSIS — L600 Ingrowing nail: Secondary | ICD-10-CM

## 2022-10-24 NOTE — Patient Instructions (Signed)

## 2022-10-24 NOTE — Progress Notes (Signed)
  Subjective:  Patient ID: Stephanie Beasley, female    DOB: 04-26-1941,   MRN: [id number]  No chief complaint on file.   82 y.o. female presents for concern of right great ingrown toenail has previously had this procedure preformed first on the lateral border and then on both borders more recently this past October. Relates the whole nail is still painful and hurts with a sheet over her toe . Denies any other pedal complaints. Denies n/v/f/c.   Past Medical History:  Diagnosis Date   Anal fissure    Cataract    surgery to remove in 2016 -bilateral   Diverticulitis of colon    ? 2011   Esophageal stricture    GERD (gastroesophageal reflux disease)    Hyperlipidemia    Hypertension    Hypothyroidism    Insomnia    Osteoarthritis    knees   Osteopenia    Polymyalgia rheumatica (HCC)    Posterior vitreous detachment    Tubular adenoma polyp of rectum    Vitamin B12 deficiency    Vitamin D insufficiency     Objective:  Physical Exam: Vascular: DP/PT pulses 2/4 bilateral. CFT <3 seconds. Normal hair growth on digits. No edema.  Skin. No lacerations or abrasions bilateral feet.  Tender to the right great toenail dsitally. The medial and lateral borders appear well healed. No erythema edema or purulence noted.  Musculoskeletal: MMT 5/5 bilateral lower extremities in DF, PF, Inversion and Eversion. Deceased ROM in DF of ankle joint.  Neurological: Sensation intact to light touch.   Assessment:   1. Ingrown nail of great toe of right foot      Plan:  Patient was evaluated and treated and all questions answered. Discussed ingrown toenails etiology and treatment options including procedure for removal vs conservative care.  Patient requesting removal of ingrown nail today. Procedure below.  Discussed procedure and post procedure care and patient expressed understanding.  Will follow-up in 2 weeks for nail check or sooner if any problems arise.    Procedure:  Procedure: total Nail  Avulsion of right hallux nail Surgeon: Lorenda Peck, DPM  Pre-op Dx: Ingrown toenail without infection Post-op: Same  Place of Surgery: Office exam room.  Indications for surgery: Painful and ingrown toenail.    The patient is requesting removal of nail with chemical matrixectomy. Risks and complications were discussed with the patient for which they understand and written consent was obtained. Under sterile conditions a total of 3 mL of  1% lidocaine plain was infiltrated in a hallux block fashion. Once anesthetized, the skin was prepped in sterile fashion. A tourniquet was then applied. Next the entire right hallux nail was removed with hemostats.  Next phenol was then applied under standard conditions and copiously irrigated. Silvadene was applied. A dry sterile dressing was applied. After application of the dressing the tourniquet was removed and there is found to be an immediate capillary refill time to the digit. The patient tolerated the procedure well without any complications. Post procedure instructions were discussed the patient for which he verbally understood. Follow-up in two weeks for nail check or sooner if any problems are to arise. Discussed signs/symptoms of infection and directed to call the office immediately should any occur or go directly to the emergency room. In the meantime, encouraged to call the office with any questions, concerns, changes symptoms.   Lorenda Peck, DPM

## 2022-10-25 DIAGNOSIS — R82998 Other abnormal findings in urine: Secondary | ICD-10-CM | POA: Diagnosis not present

## 2022-10-25 DIAGNOSIS — I10 Essential (primary) hypertension: Secondary | ICD-10-CM | POA: Diagnosis not present

## 2022-11-07 ENCOUNTER — Encounter: Payer: Self-pay | Admitting: Podiatry

## 2022-11-07 ENCOUNTER — Ambulatory Visit (INDEPENDENT_AMBULATORY_CARE_PROVIDER_SITE_OTHER): Payer: Medicare Other | Admitting: Podiatry

## 2022-11-07 DIAGNOSIS — L600 Ingrowing nail: Secondary | ICD-10-CM

## 2022-11-07 NOTE — Progress Notes (Signed)
  Subjective:  Patient ID: Stephanie Beasley, female    DOB: [birth year],   MRN: [id number]  Chief Complaint  Patient presents with   Ingrown Toenail    2 weeks (around 11/07/2022) for nail check right - still very sore    82 y.o. female presents for follow-up of right great ingrown nail procedure. Relates doing well with minimal pain.  . Denies any other pedal complaints. Denies n/v/f/c.   Past Medical History:  Diagnosis Date   Anal fissure    Cataract    surgery to remove in 2016 -bilateral   Diverticulitis of colon    ? 2011   Esophageal stricture    GERD (gastroesophageal reflux disease)    Hyperlipidemia    Hypertension    Hypothyroidism    Insomnia    Osteoarthritis    knees   Osteopenia    Polymyalgia rheumatica (HCC)    Posterior vitreous detachment    Tubular adenoma polyp of rectum    Vitamin B12 deficiency    Vitamin D insufficiency     Objective:  Physical Exam: Vascular: DP/PT pulses 2/4 bilateral. CFT <3 seconds. Normal hair growth on digits. No edema.  Skin. No lacerations or abrasions bilateral feet. Right hallux nail healing well.  Musculoskeletal: MMT 5/5 bilateral lower extremities in DF, PF, Inversion and Eversion. Deceased ROM in DF of ankle joint.  Neurological: Sensation intact to light touch.   Assessment:   1. Ingrown nail of great toe of right foot      Plan:  Patient was evaluated and treated and all questions answered. Toe was evaluated and appears to be healing well.  May discontinue soaks and neosporin.  Patient to follow-up as needed.    Lorenda Peck, DPM

## 2022-12-13 ENCOUNTER — Other Ambulatory Visit: Payer: Self-pay | Admitting: Internal Medicine

## 2022-12-13 DIAGNOSIS — Z1231 Encounter for screening mammogram for malignant neoplasm of breast: Secondary | ICD-10-CM

## 2022-12-14 ENCOUNTER — Ambulatory Visit (INDEPENDENT_AMBULATORY_CARE_PROVIDER_SITE_OTHER): Payer: Medicare Other

## 2022-12-14 DIAGNOSIS — Z1231 Encounter for screening mammogram for malignant neoplasm of breast: Secondary | ICD-10-CM

## 2023-01-10 DIAGNOSIS — M353 Polymyalgia rheumatica: Secondary | ICD-10-CM | POA: Diagnosis not present

## 2023-01-10 DIAGNOSIS — E663 Overweight: Secondary | ICD-10-CM | POA: Diagnosis not present

## 2023-01-10 DIAGNOSIS — Z1589 Genetic susceptibility to other disease: Secondary | ICD-10-CM | POA: Diagnosis not present

## 2023-01-10 DIAGNOSIS — M0609 Rheumatoid arthritis without rheumatoid factor, multiple sites: Secondary | ICD-10-CM | POA: Diagnosis not present

## 2023-01-10 DIAGNOSIS — M1991 Primary osteoarthritis, unspecified site: Secondary | ICD-10-CM | POA: Diagnosis not present

## 2023-01-10 DIAGNOSIS — Z6829 Body mass index (BMI) 29.0-29.9, adult: Secondary | ICD-10-CM | POA: Diagnosis not present

## 2023-01-26 IMAGING — MG MM DIGITAL SCREENING BILAT W/ TOMO AND CAD
6 of 10 series · 6 of 30 positions shown · non-contrast
Comparison: Previous exam(s).

CLINICAL DATA: Screening.

EXAM:
DIGITAL SCREENING BILATERAL MAMMOGRAM WITH TOMO AND CAD

[L CC synth-2D]
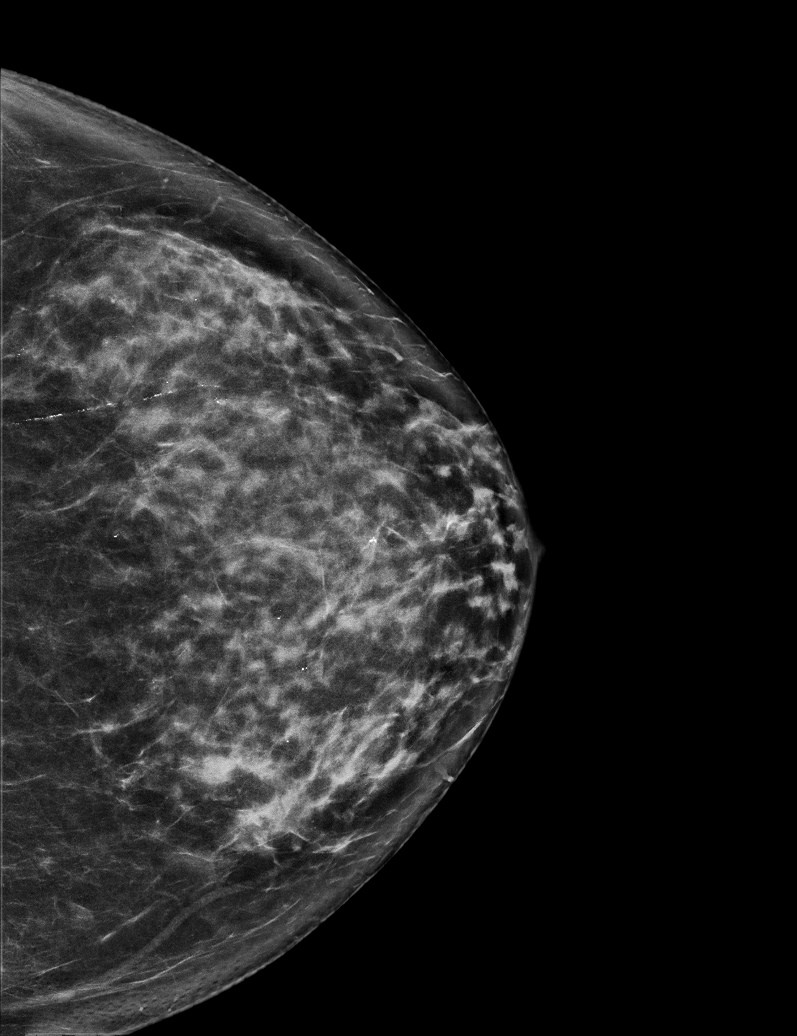

[R MLO synth-2D]
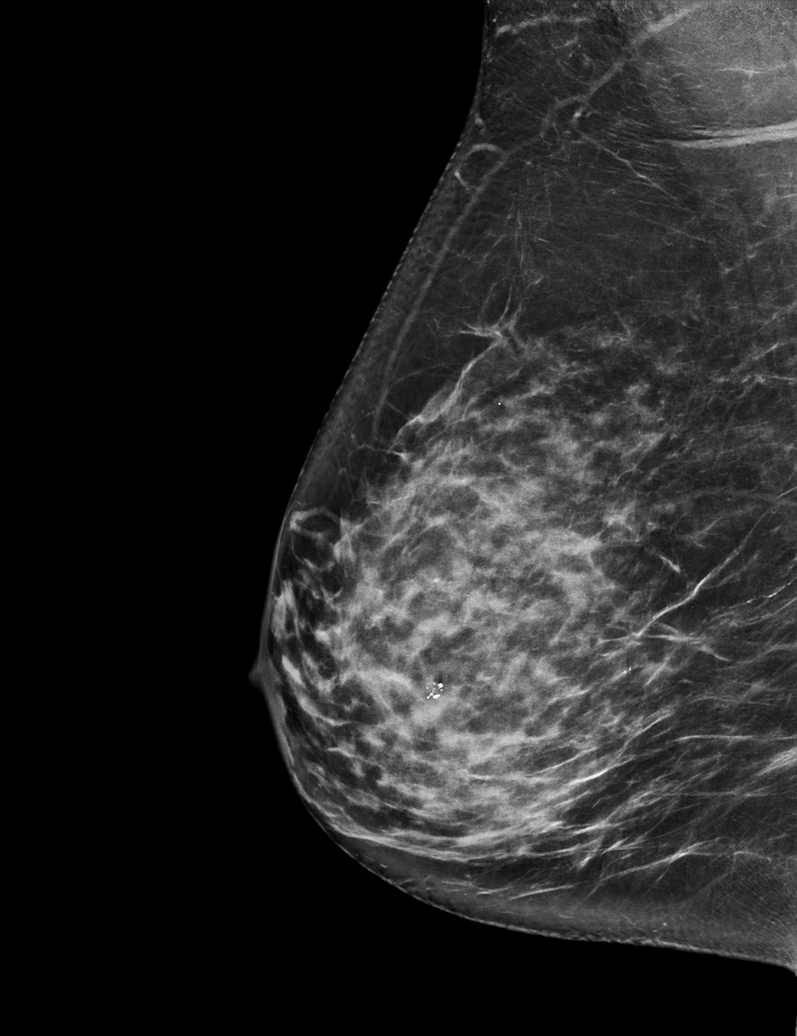

[L MLO synth-2D]
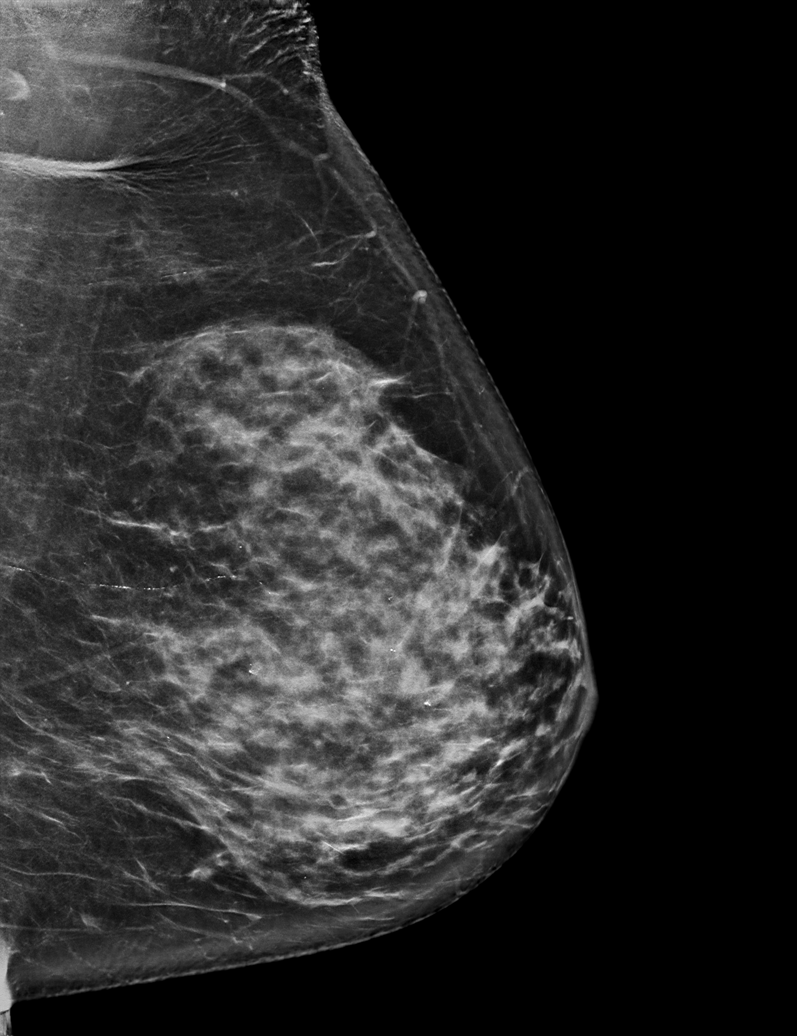

[L CV synth-2D]
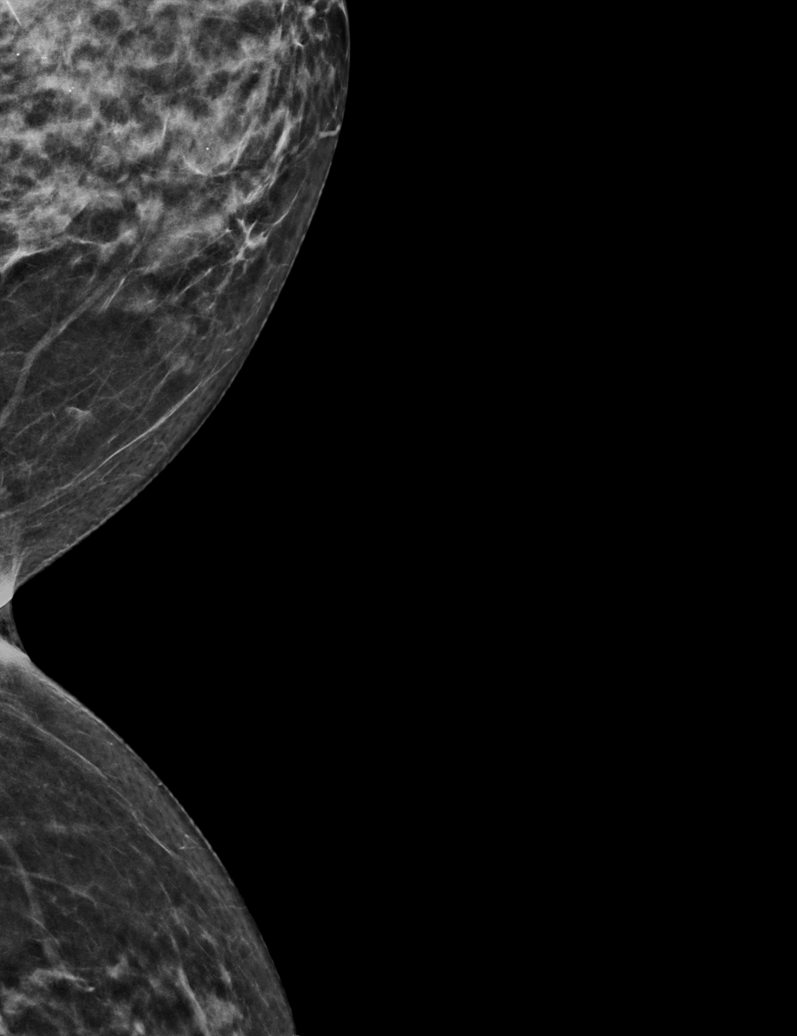

[R CC synth-2D]
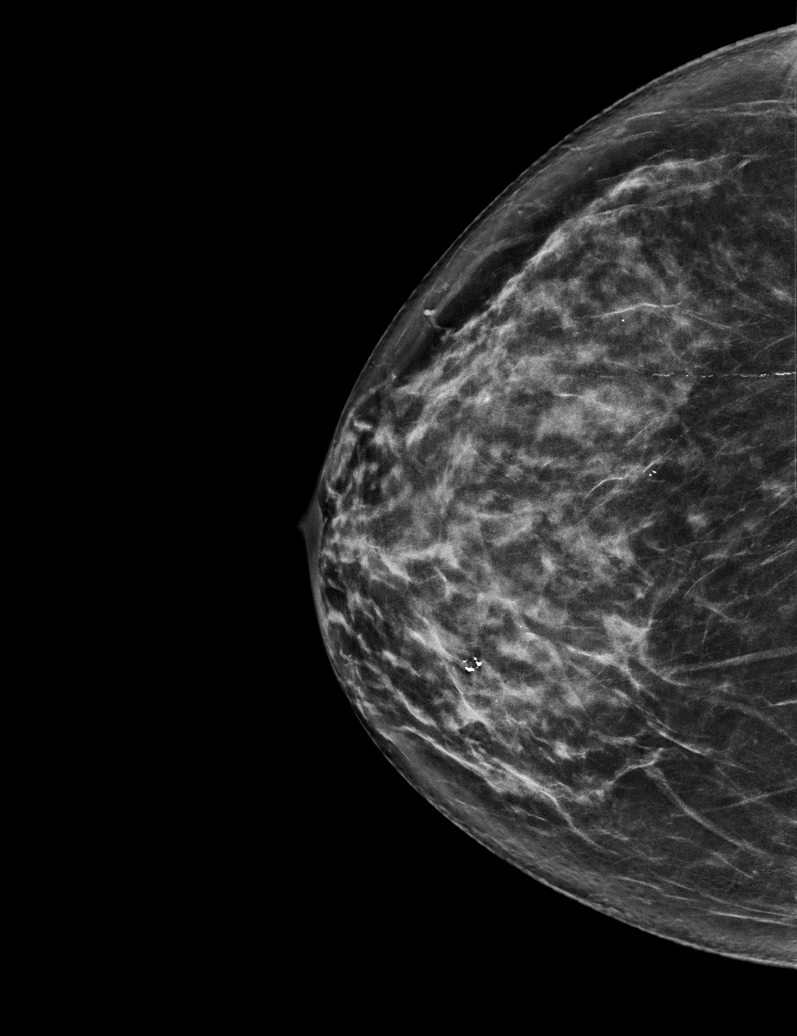

[R CC tomo · tomo slice 34/67.0]
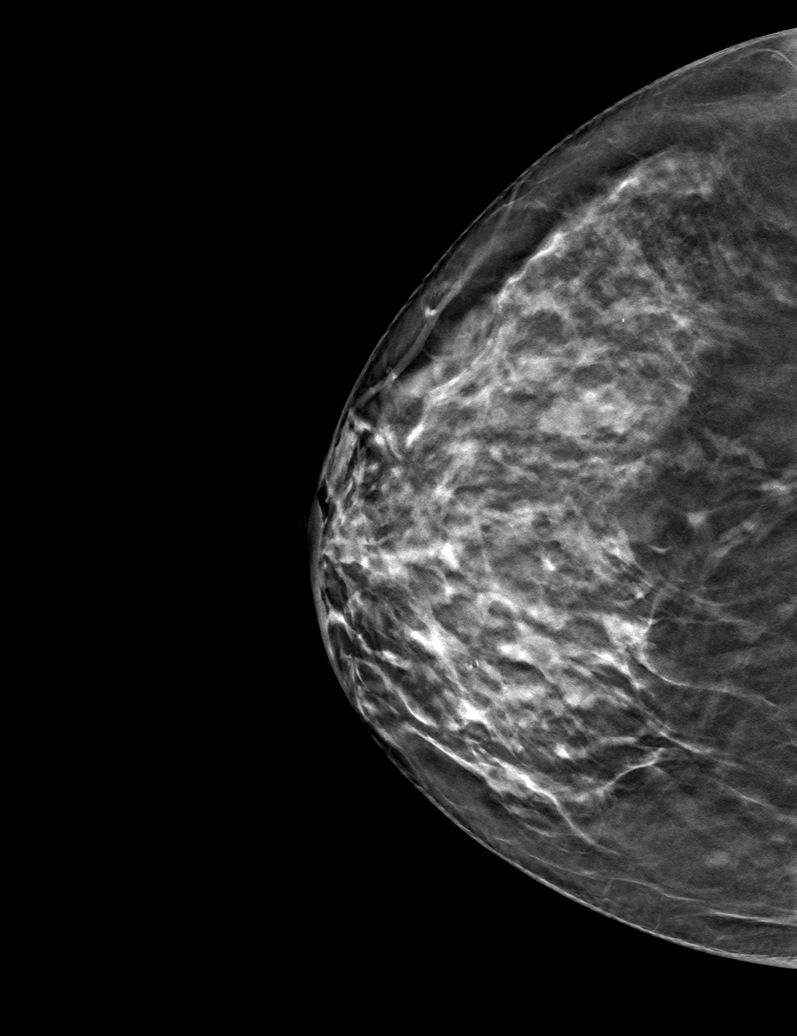

[6 of 30 positions shown; findings below may reference images not displayed]

ACR Breast Density Category d: The breast tissue is extremely dense,
which lowers the sensitivity of mammography
FINDINGS: There are no findings suspicious for malignancy. The images were
evaluated with computer-aided detection.
IMPRESSION: No mammographic evidence of malignancy. A result letter of this
screening mammogram will be mailed directly to the patient.

RECOMMENDATION:
Screening mammogram in one year. (Code:S5-Y-XW9)

BI-RADS CATEGORY  1: Negative.

## 2023-04-04 DIAGNOSIS — M0609 Rheumatoid arthritis without rheumatoid factor, multiple sites: Secondary | ICD-10-CM | POA: Diagnosis not present

## 2023-04-04 DIAGNOSIS — E663 Overweight: Secondary | ICD-10-CM | POA: Diagnosis not present

## 2023-04-04 DIAGNOSIS — M353 Polymyalgia rheumatica: Secondary | ICD-10-CM | POA: Diagnosis not present

## 2023-04-04 DIAGNOSIS — Z6829 Body mass index (BMI) 29.0-29.9, adult: Secondary | ICD-10-CM | POA: Diagnosis not present

## 2023-04-04 DIAGNOSIS — Z1589 Genetic susceptibility to other disease: Secondary | ICD-10-CM | POA: Diagnosis not present

## 2023-04-04 DIAGNOSIS — M1991 Primary osteoarthritis, unspecified site: Secondary | ICD-10-CM | POA: Diagnosis not present

## 2023-05-24 DIAGNOSIS — Z961 Presence of intraocular lens: Secondary | ICD-10-CM | POA: Diagnosis not present

## 2023-05-24 DIAGNOSIS — H04123 Dry eye syndrome of bilateral lacrimal glands: Secondary | ICD-10-CM | POA: Diagnosis not present

## 2023-05-24 DIAGNOSIS — H43313 Vitreous membranes and strands, bilateral: Secondary | ICD-10-CM | POA: Diagnosis not present

## 2023-05-30 DIAGNOSIS — Z6829 Body mass index (BMI) 29.0-29.9, adult: Secondary | ICD-10-CM | POA: Diagnosis not present

## 2023-05-30 DIAGNOSIS — M0609 Rheumatoid arthritis without rheumatoid factor, multiple sites: Secondary | ICD-10-CM | POA: Diagnosis not present

## 2023-05-30 DIAGNOSIS — E663 Overweight: Secondary | ICD-10-CM | POA: Diagnosis not present

## 2023-05-30 DIAGNOSIS — M1991 Primary osteoarthritis, unspecified site: Secondary | ICD-10-CM | POA: Diagnosis not present

## 2023-05-30 DIAGNOSIS — G629 Polyneuropathy, unspecified: Secondary | ICD-10-CM | POA: Diagnosis not present

## 2023-05-30 DIAGNOSIS — Z1589 Genetic susceptibility to other disease: Secondary | ICD-10-CM | POA: Diagnosis not present

## 2023-05-30 DIAGNOSIS — M353 Polymyalgia rheumatica: Secondary | ICD-10-CM | POA: Diagnosis not present

## 2023-05-30 DIAGNOSIS — E538 Deficiency of other specified B group vitamins: Secondary | ICD-10-CM | POA: Diagnosis not present

## 2023-05-30 DIAGNOSIS — R5383 Other fatigue: Secondary | ICD-10-CM | POA: Diagnosis not present

## 2023-07-14 DIAGNOSIS — Z23 Encounter for immunization: Secondary | ICD-10-CM | POA: Diagnosis not present

## 2023-10-24 DIAGNOSIS — E041 Nontoxic single thyroid nodule: Secondary | ICD-10-CM | POA: Diagnosis not present

## 2023-10-24 DIAGNOSIS — I129 Hypertensive chronic kidney disease with stage 1 through stage 4 chronic kidney disease, or unspecified chronic kidney disease: Secondary | ICD-10-CM | POA: Diagnosis not present

## 2023-10-24 DIAGNOSIS — E785 Hyperlipidemia, unspecified: Secondary | ICD-10-CM | POA: Diagnosis not present

## 2023-10-24 DIAGNOSIS — R739 Hyperglycemia, unspecified: Secondary | ICD-10-CM | POA: Diagnosis not present

## 2023-10-24 DIAGNOSIS — N1831 Chronic kidney disease, stage 3a: Secondary | ICD-10-CM | POA: Diagnosis not present

## 2023-10-24 DIAGNOSIS — E538 Deficiency of other specified B group vitamins: Secondary | ICD-10-CM | POA: Diagnosis not present

## 2023-10-31 DIAGNOSIS — I10 Essential (primary) hypertension: Secondary | ICD-10-CM | POA: Diagnosis not present

## 2023-10-31 DIAGNOSIS — R82998 Other abnormal findings in urine: Secondary | ICD-10-CM | POA: Diagnosis not present

## 2023-10-31 DIAGNOSIS — Z23 Encounter for immunization: Secondary | ICD-10-CM | POA: Diagnosis not present

## 2023-11-10 ENCOUNTER — Other Ambulatory Visit: Payer: Self-pay | Admitting: Internal Medicine

## 2023-11-10 DIAGNOSIS — Z1231 Encounter for screening mammogram for malignant neoplasm of breast: Secondary | ICD-10-CM

## 2023-11-28 DIAGNOSIS — M353 Polymyalgia rheumatica: Secondary | ICD-10-CM | POA: Diagnosis not present

## 2023-11-28 DIAGNOSIS — M1991 Primary osteoarthritis, unspecified site: Secondary | ICD-10-CM | POA: Diagnosis not present

## 2023-11-28 DIAGNOSIS — Z1589 Genetic susceptibility to other disease: Secondary | ICD-10-CM | POA: Diagnosis not present

## 2023-11-28 DIAGNOSIS — Z6828 Body mass index (BMI) 28.0-28.9, adult: Secondary | ICD-10-CM | POA: Diagnosis not present

## 2023-11-28 DIAGNOSIS — M0609 Rheumatoid arthritis without rheumatoid factor, multiple sites: Secondary | ICD-10-CM | POA: Diagnosis not present

## 2023-11-28 DIAGNOSIS — E663 Overweight: Secondary | ICD-10-CM | POA: Diagnosis not present

## 2023-12-21 ENCOUNTER — Ambulatory Visit

## 2023-12-21 DIAGNOSIS — Z1231 Encounter for screening mammogram for malignant neoplasm of breast: Secondary | ICD-10-CM | POA: Diagnosis not present

## 2024-02-05 ENCOUNTER — Other Ambulatory Visit: Payer: Self-pay

## 2024-02-05 ENCOUNTER — Emergency Department (HOSPITAL_BASED_OUTPATIENT_CLINIC_OR_DEPARTMENT_OTHER)

## 2024-02-05 ENCOUNTER — Encounter (HOSPITAL_BASED_OUTPATIENT_CLINIC_OR_DEPARTMENT_OTHER): Payer: Self-pay | Admitting: Emergency Medicine

## 2024-02-05 ENCOUNTER — Emergency Department (HOSPITAL_BASED_OUTPATIENT_CLINIC_OR_DEPARTMENT_OTHER)
Admission: EM | Admit: 2024-02-05 | Discharge: 2024-02-06 | Disposition: A | Discharge status: Home / Self Care | Attending: Emergency Medicine | Admitting: Emergency Medicine

## 2024-02-05 DIAGNOSIS — R0789 Other chest pain: Secondary | ICD-10-CM | POA: Insufficient documentation

## 2024-02-05 DIAGNOSIS — N3001 Acute cystitis with hematuria: Secondary | ICD-10-CM | POA: Diagnosis not present

## 2024-02-05 DIAGNOSIS — R079 Chest pain, unspecified: Secondary | ICD-10-CM | POA: Diagnosis not present

## 2024-02-05 DIAGNOSIS — R350 Frequency of micturition: Secondary | ICD-10-CM | POA: Diagnosis present

## 2024-02-05 DIAGNOSIS — R918 Other nonspecific abnormal finding of lung field: Secondary | ICD-10-CM | POA: Diagnosis not present

## 2024-02-05 LAB — CBC
HCT: 43.9 % (ref 36.0–46.0)
Hemoglobin: 14.4 g/dL (ref 12.0–15.0)
MCH: 29.1 pg (ref 26.0–34.0)
MCHC: 32.8 g/dL (ref 30.0–36.0)
MCV: 88.9 fL (ref 80.0–100.0)
Platelets: 206 10*3/uL (ref 150–400)
RBC: 4.94 MIL/uL (ref 3.87–5.11)
RDW: 12.9 % (ref 11.5–15.5)
WBC: 9 10*3/uL (ref 4.0–10.5)
nRBC: 0 % (ref 0.0–0.2)

## 2024-02-05 LAB — URINALYSIS, MICROSCOPIC (REFLEX)
RBC / HPF: >50 RBC/hpf (ref 0–5)
WBC, UA: >50 WBC/hpf (ref 0–5)

## 2024-02-05 LAB — BASIC METABOLIC PANEL WITH GFR
Anion gap: 16 — ABNORMAL HIGH (ref 5–15)
BUN: 21 mg/dL (ref 8–23)
CO2: 19 mmol/L — ABNORMAL LOW (ref 22–32)
Calcium: 9.6 mg/dL (ref 8.9–10.3)
Chloride: 106 mmol/L (ref 98–111)
Creatinine, Ser: 0.92 mg/dL (ref 0.44–1.00)
GFR, Estimated: >60 mL/min (ref >60)
Glucose, Bld: 111 mg/dL — ABNORMAL HIGH (ref 70–99)
Potassium: 4.4 mmol/L (ref 3.5–5.1)
Sodium: 142 mmol/L (ref 135–145)

## 2024-02-05 LAB — URINALYSIS, ROUTINE W REFLEX MICROSCOPIC
Bilirubin Urine: NEGATIVE
Glucose, UA: NEGATIVE mg/dL
Ketones, ur: NEGATIVE mg/dL
Nitrite: NEGATIVE
Protein, ur: 100 mg/dL — AB
Specific Gravity, Urine: 1.015 (ref 1.005–1.030)
pH: 5.5 (ref 5.0–8.0)

## 2024-02-05 LAB — TROPONIN T, HIGH SENSITIVITY
Troponin T High Sensitivity: 16 ng/L (ref <19)
Troponin T High Sensitivity: 17 ng/L (ref <19)

## 2024-02-05 MED ORDER — CEPHALEXIN 250 MG PO CAPS
500.0000 mg | ORAL_CAPSULE | Freq: Once | ORAL | Status: AC
  Administered 2024-02-05: 500 mg via ORAL
  Filled 2024-02-05: qty 2

## 2024-02-05 NOTE — ED Provider Notes (Signed)
 [place] [facility]  Provider Note  CSN: [id number] Arrival date & time: 02/05/24 2001  History Chief Complaint  Patient presents with   Chest Pain   Urinary Frequency    Stephanie Beasley is a 83 y.o. female reports pain with urination for the last several hours, states when she urinates she also has discomfort in her chest. She does not have any fever, flank pain, N/V, SOB or diaphoresis. She does not have any known CAD. She states her chest discomfort only happens when she is urinating.    Home Medications Prior to Admission medications   Medication Sig Start Date End Date Taking? Authorizing Provider  cephALEXin  (KEFLEX ) 500 MG capsule Take 1 capsule (500 mg total) by mouth 2 (two) times daily for 7 days. 02/06/24 02/13/24 Yes Charmayne Cooper, MD  acetaminophen  (TYLENOL ) 500 MG tablet Take 500-1,000 mg by mouth 2 (two) times daily as needed (pain.).    [provider]  alendronate (FOSAMAX) 70 MG tablet Take 70 mg by mouth every 14 (fourteen) days. Take with a full glass of water  on an empty stomach.    [provider]  ALPRAZolam  (XANAX ) 1 MG tablet Take 1 mg by mouth at bedtime.    [provider]  cholecalciferol (VITAMIN D) 25 MCG (1000 UNIT) tablet Take 1,000 Units by mouth every evening.    [provider]  Cyanocobalamin  (B-12) 1000 MCG TABS Take 1,000 mcg by mouth every evening.    [provider]  famotidine -calcium carbonate-magnesium  hydroxide (PEPCID  COMPLETE) 10-800-165 MG chewable tablet Chew 1 tablet by mouth 2 (two) times daily as needed (acid reflux/indigestion.).    [provider]  lisinopril  (PRINIVIL ,ZESTRIL ) 40 MG tablet Take 40 mg by mouth in the morning.    [provider]  predniSONE  (DELTASONE ) 5 MG tablet Take 5 mg by mouth in the morning. 06/11/21   [provider]  simvastatin  (ZOCOR ) 20 MG tablet Take 20 mg by mouth every evening.    [provider]     Allergies    Hydrocodone    Review of Systems   Review of Systems Please see HPI for pertinent positives and negatives  Physical Exam BP (!) 153/102 (BP Location: Right Arm)   Pulse 89   Temp 97.7 F (36.5 C)   Resp 20   Ht 5\' 7"  (1.702 m)   SpO2 94%   BMI 28.66 kg/m   Physical Exam Vitals and nursing note reviewed.  Constitutional:      Appearance: Normal appearance.  HENT:     Head: Normocephalic and atraumatic.     Nose: Nose normal.     Mouth/Throat:     Mouth: Mucous membranes are moist.  Eyes:     Extraocular Movements: Extraocular movements intact.     Conjunctiva/sclera: Conjunctivae normal.  Cardiovascular:     Rate and Rhythm: Normal rate.  Pulmonary:     Effort: Pulmonary effort is normal.     Breath sounds: Normal breath sounds.  Abdominal:     General: Abdomen is flat.     Palpations: Abdomen is soft.     Tenderness: There is no abdominal tenderness.  Musculoskeletal:        General: No swelling. Normal range of motion.     Cervical back: Neck supple.  Skin:    General: Skin is warm and dry.  Neurological:     General: No focal deficit present.     Mental Status: She is  alert.  Psychiatric:        Mood and Affect: Mood normal.     ED Results / Procedures / Treatments   EKG EKG Interpretation Date/Time:  Monday February 05 2024 20:11:35 EDT Ventricular Rate:  97 PR Interval:  112 QRS Duration:  89 QT Interval:  344 QTC Calculation: 437 R Axis:   69  Text Interpretation: Sinus rhythm Atrial premature complex Borderline short PR interval Nonspecific T abnormalities, lateral leads Confirmed by Abner Hoffman [id number]) on 02/05/2024 9:03:23 PM  Procedures Procedures  Medications Ordered in the ED Medications  cephALEXin  (KEFLEX ) capsule 500 mg (500 mg Oral Given 02/05/24 2334)    Initial Impression and Plan  Patient here primarily for dysuria, has taken some AZO without change. Also having atypical chest pain but only when  she urinates. No history of CAD. Labs done in triage show unremarkable CBC BMP and Trop. UA is pending. I personally viewed the images from radiology studies and agree with radiologist interpretation: CXR without acute findings.   ED Course   Clinical Course as of 02/06/24 0020  Mon Feb 05, 2024  2329 UA is consistent with UTI. Will add culture, start Keflex .  [CS]  2357 Repeat trop unchanged. Low concern for ACS. Plan discharge with Rx for Keflex , PCP follow up, RTED for any other concerns.   [CS]    Clinical Course User Index [CS] Charmayne Cooper, MD     MDM Rules/Calculators/A&P Medical Decision Making Problems Addressed: Acute cystitis with hematuria: acute illness or injury Atypical chest pain: acute illness or injury  Amount and/or Complexity of Data Reviewed Labs: ordered. Decision-making details documented in ED Course. Radiology: ordered and independent interpretation performed. Decision-making details documented in ED Course. ECG/medicine tests: ordered and independent interpretation performed. Decision-making details documented in ED Course.  Risk Prescription drug management.     Final Clinical Impression(s) / ED Diagnoses Final diagnoses:  Acute cystitis with hematuria  Atypical chest pain    Rx / DC Orders ED Discharge Orders          Ordered    cephALEXin  (KEFLEX ) 500 MG capsule  2 times daily        02/06/24 0020             Charmayne Cooper, MD 02/06/24 0020

## 2024-02-05 NOTE — ED Triage Notes (Signed)
 Pt POV- c/o chest pain since this afternoon, denies  n/v/diaphoresis/shob.   Also c/o increased urinary frequency since this afternoon.

## 2024-02-06 MED ORDER — CEPHALEXIN 500 MG PO CAPS: 500.0000 mg | ORAL_CAPSULE | Freq: Two times a day (BID) | ORAL | 0 refills | Status: AC

## 2024-02-08 DIAGNOSIS — Z7952 Long term (current) use of systemic steroids: Secondary | ICD-10-CM | POA: Diagnosis not present

## 2024-02-08 DIAGNOSIS — N390 Urinary tract infection, site not specified: Secondary | ICD-10-CM | POA: Diagnosis not present

## 2024-02-08 DIAGNOSIS — I341 Nonrheumatic mitral (valve) prolapse: Secondary | ICD-10-CM | POA: Diagnosis not present

## 2024-02-08 DIAGNOSIS — M353 Polymyalgia rheumatica: Secondary | ICD-10-CM | POA: Diagnosis not present

## 2024-02-08 DIAGNOSIS — Z1589 Genetic susceptibility to other disease: Secondary | ICD-10-CM | POA: Diagnosis not present

## 2024-02-08 DIAGNOSIS — I10 Essential (primary) hypertension: Secondary | ICD-10-CM | POA: Diagnosis not present

## 2024-02-08 DIAGNOSIS — N1831 Chronic kidney disease, stage 3a: Secondary | ICD-10-CM | POA: Diagnosis not present

## 2024-02-08 DIAGNOSIS — R0789 Other chest pain: Secondary | ICD-10-CM | POA: Diagnosis not present

## 2024-02-08 DIAGNOSIS — R413 Other amnesia: Secondary | ICD-10-CM | POA: Diagnosis not present

## 2024-02-08 DIAGNOSIS — M0600 Rheumatoid arthritis without rheumatoid factor, unspecified site: Secondary | ICD-10-CM | POA: Diagnosis not present

## 2024-02-08 LAB — URINE CULTURE: Culture: 80000 — AB

## 2024-02-09 ENCOUNTER — Telehealth (HOSPITAL_BASED_OUTPATIENT_CLINIC_OR_DEPARTMENT_OTHER): Payer: Self-pay | Admitting: *Deleted

## 2024-02-09 NOTE — Telephone Encounter (Signed)
 Post ED Visit - Positive Culture Follow-up  Culture report reviewed by antimicrobial stewardship pharmacist: [facility] [x]  [place], [place].D. []  Skeet Duke, Pharm.D., BCPS AQ-ID []  Leslee Rase, Pharm.D., BCPS []  [place]land [place], [address].D., BCPS []  [place], [place].D., BCPS, AAHIVP []  Alcide Aly, Pharm.D., BCPS, AAHIVP []  Jerri Morale, PharmD, BCPS []  Graham Laws, PharmD, BCPS []  Cleda Curly, PharmD, BCPS []  Tamar Fairly, PharmD []  Ballard Levels, PharmD, BCPS []  Ollen Beverage, PharmD  [facility] []  Arlyne Bering, PharmD []  Sherryle Don, PharmD []  [place], PharmD []  Delila Felty, Rph []  [place]) Cleora Daft, PharmD []  Augustina Block, PharmD []  Arie Kurtz, PharmD []  Sharlyn Deaner, PharmD []  Agnes Hose, PharmD []  Kendall Pauls, PharmD []  Gladstone Lamer, PharmD []  Armanda Bern, PharmD []  Tera Fellows, PharmD   Positive urine culture Treated with cephalexin , organism sensitive to the same and no further patient follow-up is required at this time.  Zeb Heys 02/09/2024, 9:34 AM

## 2024-04-29 DIAGNOSIS — R413 Other amnesia: Secondary | ICD-10-CM | POA: Diagnosis not present

## 2024-04-29 DIAGNOSIS — E538 Deficiency of other specified B group vitamins: Secondary | ICD-10-CM | POA: Diagnosis not present

## 2024-04-29 DIAGNOSIS — I10 Essential (primary) hypertension: Secondary | ICD-10-CM | POA: Diagnosis not present

## 2024-04-29 DIAGNOSIS — M353 Polymyalgia rheumatica: Secondary | ICD-10-CM | POA: Diagnosis not present

## 2024-04-29 DIAGNOSIS — Z1589 Genetic susceptibility to other disease: Secondary | ICD-10-CM | POA: Diagnosis not present

## 2024-04-29 DIAGNOSIS — E785 Hyperlipidemia, unspecified: Secondary | ICD-10-CM | POA: Diagnosis not present

## 2024-04-29 DIAGNOSIS — R739 Hyperglycemia, unspecified: Secondary | ICD-10-CM | POA: Diagnosis not present

## 2024-04-29 DIAGNOSIS — F419 Anxiety disorder, unspecified: Secondary | ICD-10-CM | POA: Diagnosis not present

## 2024-04-29 DIAGNOSIS — R0789 Other chest pain: Secondary | ICD-10-CM | POA: Diagnosis not present

## 2024-04-29 DIAGNOSIS — N1831 Chronic kidney disease, stage 3a: Secondary | ICD-10-CM | POA: Diagnosis not present

## 2024-04-29 DIAGNOSIS — Z7952 Long term (current) use of systemic steroids: Secondary | ICD-10-CM | POA: Diagnosis not present

## 2024-04-29 DIAGNOSIS — I341 Nonrheumatic mitral (valve) prolapse: Secondary | ICD-10-CM | POA: Diagnosis not present

## 2024-05-23 DIAGNOSIS — H43313 Vitreous membranes and strands, bilateral: Secondary | ICD-10-CM | POA: Diagnosis not present

## 2024-05-23 DIAGNOSIS — M353 Polymyalgia rheumatica: Secondary | ICD-10-CM | POA: Diagnosis not present

## 2024-05-23 DIAGNOSIS — H524 Presbyopia: Secondary | ICD-10-CM | POA: Diagnosis not present

## 2024-05-23 DIAGNOSIS — H04123 Dry eye syndrome of bilateral lacrimal glands: Secondary | ICD-10-CM | POA: Diagnosis not present

## 2024-05-23 DIAGNOSIS — Z961 Presence of intraocular lens: Secondary | ICD-10-CM | POA: Diagnosis not present

## 2024-05-28 DIAGNOSIS — Z1589 Genetic susceptibility to other disease: Secondary | ICD-10-CM | POA: Diagnosis not present

## 2024-05-28 DIAGNOSIS — M353 Polymyalgia rheumatica: Secondary | ICD-10-CM | POA: Diagnosis not present

## 2024-05-28 DIAGNOSIS — E663 Overweight: Secondary | ICD-10-CM | POA: Diagnosis not present

## 2024-05-28 DIAGNOSIS — M0609 Rheumatoid arthritis without rheumatoid factor, multiple sites: Secondary | ICD-10-CM | POA: Diagnosis not present

## 2024-05-28 DIAGNOSIS — M1991 Primary osteoarthritis, unspecified site: Secondary | ICD-10-CM | POA: Diagnosis not present

## 2024-05-28 DIAGNOSIS — Z6828 Body mass index (BMI) 28.0-28.9, adult: Secondary | ICD-10-CM | POA: Diagnosis not present
